# Patient Record
Sex: Female | Born: 1950 | Race: White | Hispanic: No | Marital: Married | Smoking: Former smoker
Health system: Southern US, Community
[De-identification: ages and names within clinical notes are randomized; demographics above are authoritative.]

---

## 2016-04-11 ENCOUNTER — Encounter: Payer: Self-pay | Admitting: Internal Medicine

## 2016-04-11 ENCOUNTER — Ambulatory Visit (INDEPENDENT_AMBULATORY_CARE_PROVIDER_SITE_OTHER): Payer: Medicare Other | Admitting: Internal Medicine

## 2016-04-11 ENCOUNTER — Ambulatory Visit (INDEPENDENT_AMBULATORY_CARE_PROVIDER_SITE_OTHER)
Admission: RE | Admit: 2016-04-11 | Discharge: 2016-04-11 | Disposition: A | Discharge status: Home / Self Care | Payer: Medicare Other | Source: Ambulatory Visit | Attending: Internal Medicine | Admitting: Internal Medicine

## 2016-04-11 VITALS — BP 124/72 | HR 124 | Ht 65.5 in | Wt 135.6 lb

## 2016-04-11 DIAGNOSIS — J449 Chronic obstructive pulmonary disease, unspecified: Secondary | ICD-10-CM

## 2016-04-11 DIAGNOSIS — J9611 Chronic respiratory failure with hypoxia: Secondary | ICD-10-CM

## 2016-04-11 MED ORDER — TIOTROPIUM BROMIDE MONOHYDRATE 2.5 MCG/ACT IN AERS: 2.0000 | INHALATION_SPRAY | Freq: Every day | RESPIRATORY_TRACT | 11 refills | Status: DC

## 2016-04-11 NOTE — Patient Instructions (Addendum)
Plan A = Automatic = symbicort 160 Take 2 puffs first thing in am and then another 2 puffs about 12 hours later                                     spiriva respimat 2 each am   Plan B = Backup Only use your albuterol as a rescue medication to be used if you can't catch your breath by resting or doing a relaxed purse lip breathing pattern.  - The less you use it, the better it will work when you need it. - Ok to use the inhaler up to 2 puffs  every 4 hours if you must but call for appointment if use goes up over your usual need - Don't leave home without it !!  (think of it like the spare tire for your car)   Plan C = Crisis - only use your albuterol nebulizer if you first try Plan B and it fails to help > ok to use the nebulizer up to every 4 hours but if start needing it regularly call for immediate appointment    Please remember to go to the  x-ray department downstairs for your tests - we will call you with the results when they are available.  Please schedule a follow up office visit in 6 weeks, call sooner if needed

## 2016-04-11 NOTE — Progress Notes (Signed)
Subjective:    Patient ID: Emma Hudson, female    DOB: 04/11/1951,    MRN: [id number]  HPI  [age] yowf quit smoking 2009 with symbicort/spiriva dpi  dep since 2007 with dx of copd and no change in ex tol on 160 2bid and 2lpm hs then admitted to [facility] with ? CAP but not back to baseline so self referred to pulmonary clinic 04/11/2016    04/11/2016 1st [place] [facility] office visit/ Emma Hudson   Chief Complaint  Patient presents with  . Pulmonary Consult    Self referral. Pt states she was dxed with COPD in 2010.  Pt states she had PNA approx 5 wks ago, and her breathing is not quite back to her normal baseline. She had already been on 2lpm o2 at bedtime since April 2017, but when she was discharged from hospital recently they advised her to use 8lpm 24/7. She is only using with sleep though, and feels daytime o2 is not needed.     90% recovered from admit still weak  Doe = MMRC2 = can't walk a nl pace on a flat grade s sob but does fine slow and flat eg  shopping ok with buggy  Has proventil rarely, not using at  all the neb   No obvious other patterns in day to day or daytime variabilty or assoc chronic cough or cp or chest tightness, subjective wheeze overt sinus or hb symptoms. No unusual exp hx or h/o childhood pna/ asthma or knowledge of premature birth.  Sleeping ok without nocturnal  or early am exacerbation  of respiratory  c/o's or need for noct saba. Also denies any obvious fluctuation of symptoms with weather or environmental changes or other aggravating or alleviating factors except as outlined above   Current Medications, Allergies, Complete Past Medical History, Past Surgical History, Family History, and Social History were reviewed in [facility] electronic medical record.              Review of Systems  Constitutional: Negative for chills, fever and unexpected weight change.  HENT: Negative for congestion, dental problem, ear pain, nosebleeds, postnasal drip,  rhinorrhea, sinus pressure, sneezing, sore throat, trouble swallowing and voice change.   Eyes: Negative for visual disturbance.  Respiratory: Positive for shortness of breath. Negative for cough and choking.   Cardiovascular: Negative for chest pain and leg swelling.  Gastrointestinal: Negative for abdominal pain, diarrhea and vomiting.  Genitourinary: Negative for difficulty urinating.  Musculoskeletal: Negative for arthralgias.  Skin: Negative for rash.  Neurological: Negative for tremors, syncope and headaches.  Hematological: Does not bruise/bleed easily.       Objective:   Physical Exam  amb wf nad   Wt Readings from Last 3 Encounters:  04/11/16 135 lb 9.6 oz (61.5 kg)    Vital signs reviewed  - Note on arrival 02 sats  94% on RA / note resting tachycardia     HEENT: nl dentition, turbinates, and oropharynx. Nl external ear canals without cough reflex   NECK :  without JVD/Nodes/TM/ nl carotid upstrokes bilaterally   LUNGS: no acc muscle use,  slt barrel contour with distant bs bilaterally  but no wheeze   CV:  RRR  no s3 or murmur or increase in P2, nad no edema   ABD:  soft and nontender with nl inspiratory excursion in the supine position. No bruits or organomegaly appreciated, bowel sounds nl  MS:  Nl gait/ ext warm without deformities, calf tenderness, cyanosis or clubbing No obvious  joint restrictions   SKIN: warm and dry without lesions    NEURO:  alert, approp, nl sensorium with  no motor or cerebellar deficits apparent.       CXR PA and Lateral:   04/11/2016 :    I personally reviewed images and agree with radiology impression as follows:    Emphysema without acute disease.       Assessment & Plan:

## 2016-04-12 ENCOUNTER — Telehealth: Payer: Self-pay | Admitting: Internal Medicine

## 2016-04-12 DIAGNOSIS — J9611 Chronic respiratory failure with hypoxia: Secondary | ICD-10-CM | POA: Insufficient documentation

## 2016-04-12 NOTE — Progress Notes (Signed)
LMTCB

## 2016-04-12 NOTE — Assessment & Plan Note (Signed)
02 rx since 2007  - as of initial pulmonary eval 04/11/2016 = 2lpm hs only - 04/11/2016   Walked RA  2 laps @ 185 ft each stopped due to  Sob but no desat at nl  pace   Adequate control on present rx, reviewed in detail with pt > no change in rx needed  = 2lpm hs, no indication for amb 02 at this point/ probably would benefit the most from pulmonary rehab

## 2016-04-12 NOTE — Telephone Encounter (Signed)
Notes Recorded by Nyoka CowdenMichael B Wert, MD on 04/12/2016 at 5:43 AM EST Call pt: Reviewed cxr and no acute change so [facility]   lmtcb

## 2016-04-12 NOTE — Assessment & Plan Note (Signed)
Spirometry 04/11/2016  FEV1 0.75 (30%)  Ratio 43 p am spiriva dpi/ sym 160  With classic curvature  -04/11/2016  After extensive coaching HFA effectiveness =    90%  04/11/2016 changed to spiriva respimat    Hx and spirometry c/w very severe copd =  Group D in terms of symptom/risk and laba/lama/ICS  therefore appropriate rx at this point.  The only component of rx I rec changing is dpi to respimat for less upper airway irritation and better lung deposition - perhaps if remains exac free could also consider just lama/laba = stiolto if insurance covers  Total time devoted to counseling  > 50 % of 60  office visit:  review case with pt/ discussion of options/alternatives/ personally creating written customized instructions  in presence of pt  then going over those specific  Instructions directly with the pt including how to use all of the meds but in particular covering each new medication in detail and the difference between the maintenance/automatic meds and the prns using an action plan format for the latter.  Please see AVS from this visit for a full list of these instructions

## 2016-04-13 NOTE — Telephone Encounter (Signed)
Spoke with pt. She is aware of results. Nothing further was needed.  

## 2016-05-23 ENCOUNTER — Encounter: Payer: Self-pay | Admitting: Internal Medicine

## 2016-05-23 ENCOUNTER — Ambulatory Visit (INDEPENDENT_AMBULATORY_CARE_PROVIDER_SITE_OTHER): Payer: Medicare Other | Admitting: Internal Medicine

## 2016-05-23 VITALS — BP 102/70 | HR 94 | Ht 64.5 in | Wt 136.2 lb

## 2016-05-23 DIAGNOSIS — J9611 Chronic respiratory failure with hypoxia: Secondary | ICD-10-CM

## 2016-05-23 DIAGNOSIS — J449 Chronic obstructive pulmonary disease, unspecified: Secondary | ICD-10-CM

## 2016-05-23 MED ORDER — TIOTROPIUM BROMIDE MONOHYDRATE 2.5 MCG/ACT IN AERS: 2.0000 | INHALATION_SPRAY | Freq: Every day | RESPIRATORY_TRACT | 11 refills | Status: DC

## 2016-05-23 NOTE — Patient Instructions (Addendum)
Please see patient coordinator before you leave today  to schedule [employer] to change out your 02 concentrator and call us with a name of any person there that does not help you with this   No change in medications - will refer you to rehab in the spring   Please schedule a follow up visit in 3 months but call sooner if needed

## 2016-05-23 NOTE — Progress Notes (Signed)
Subjective:    Patient ID: Emma Hudson, female    DOB: December 24, 1950,    MRN: [id number]   Brief patient profile:  3 yowf quit smoking 2009 with symbicort/spiriva dpi  dep since 2007 with dx of copd and no change in ex tol on 160 2bid and 2lpm hs then admitted to [facility] with ? CAP but not back to baseline so self referred to pulmonary clinic 04/11/2016    History of Present Illness  04/11/2016 1st [place] [facility] office visit/ Wert   Chief Complaint  Patient presents with  . Pulmonary Consult    Self referral. Pt states she was dxed with COPD in 2010.  Pt states she had PNA approx 5 wks ago, and her breathing is not quite back to her normal baseline. She had already been on 2lpm o2 at bedtime since April 2017, but when she was discharged from hospital recently they advised her to use 8lpm 24/7. She is only using with sleep though, and feels daytime o2 is not needed.     90% recovered from admit still weak  Doe = MMRC2 = can't walk a nl pace on a flat grade s sob but does fine slow and flat eg  shopping ok with buggy  Has proventil rarely, not using at  all the neb  rec Plan A = Automatic = symbicort 160 Take 2 puffs first thing in am and then another 2 puffs about 12 hours later                                     spiriva respimat 2 each am  Plan B = Backup Only use your albuterol as a rescue medication  Plan C = Crisis - only use your albuterol nebulizer if you first try Plan B     05/23/2016  f/u ov/Wert re: copd III/ symb 160 and spiriva  On 2lpm  Chief Complaint  Patient presents with  . Follow-up    Pt stated the cold air is affecting her COPD and is causing her to be SOB that resolves when she goes into warm areas. Pt denies chest tightness or mucus.     no change doe/ having trouble with spiriva respmat "no spray" comes out  As long as doesn't go out in cold no change doe = MMRC2 as sobve/ no need for saba    No obvious day to day or daytime variability or assoc  excess/ purulent sputum or mucus plugs or hemoptysis or cp or chest tightness, subjective wheeze or overt sinus or hb symptoms. No unusual exp hx or h/o childhood pna/ asthma or knowledge of premature birth.  Sleeping ok without nocturnal  or early am exacerbation  of respiratory  c/o's or need for noct saba. Also denies any obvious fluctuation of symptoms with weather or environmental changes or other aggravating or alleviating factors except as outlined above   Current Medications, Allergies, Complete Past Medical History, Past Surgical History, Family History, and Social History were reviewed in [employer] record.  ROS  The following are not active complaints unless bolded sore throat, dysphagia, dental problems, itching, sneezing,  nasal congestion or excess/ purulent secretions, ear ache,   fever, chills, sweats, unintended wt loss, classically pleuritic or exertional cp,  orthopnea pnd or leg swelling, presyncope, palpitations, abdominal pain, anorexia, nausea, vomiting, diarrhea  or change in bowel or bladder habits, change in stools or  urine, dysuria,hematuria,  rash, arthralgias, visual complaints, headache, numbness, weakness or ataxia or problems with walking or coordination,  change in mood/affect or memory.                Objective:   Physical Exam  amb mildly hoarse  wf nad   Wt Readings from Last 3 Encounters:  05/23/16 136 lb 3.2 oz (61.8 kg)  04/11/16 135 lb 9.6 oz (61.5 kg)    Vital signs reviewed  - Note on arrival 02 sats  97% on RA      HEENT: nl dentition, turbinates, and oropharynx. Nl external ear canals without cough reflex   NECK :  without JVD/Nodes/TM/ nl carotid upstrokes bilaterally   LUNGS: no acc muscle use,  slt barrel contour with distant bs bilaterally  but no wheezes at all   CV:  RRR  no s3 or murmur or increase in P2, nad no edema   ABD:  soft and nontender with nl inspiratory excursion in the supine position. No  bruits or organomegaly appreciated, bowel sounds nl  MS:  Nl gait/ ext warm without deformities, calf tenderness, cyanosis or clubbing No obvious joint restrictions   SKIN: warm and dry without lesions    NEURO:  alert, approp, nl sensorium with  no motor or cerebellar deficits apparent.             Assessment & Plan:

## 2016-05-23 NOTE — Assessment & Plan Note (Signed)
02 rx since 2007  - as of initial pulmonary eval 04/11/2016 = 2lpm hs only - 04/11/2016   Walked RA  2 laps @ 185 ft each stopped due to  Sob but no desat at nl  pace   As of 05/23/2016 >    2lpm  Hs only

## 2016-05-24 ENCOUNTER — Encounter: Payer: Self-pay | Admitting: Internal Medicine

## 2016-05-24 NOTE — Assessment & Plan Note (Signed)
Quit 2009 Spirometry 04/11/2016  FEV1 0.75 (30%)  Ratio 43 p am spiriva dpi/ sym 160  With classic curvature  -04/11/2016  changed to spiriva respimat   - 05/23/2016  After extensive coaching HFA effectiveness =    90% with respimat/continue spiriva /symb  She has very severe copd but well comp at present and is  Group D in terms of symptom/risk and laba/lama/ICS  therefore appropriate rx at this point so no change rx needed but may consider change to stiolto or bevespi (LAMA/LABA) at next ov

## 2016-08-21 ENCOUNTER — Ambulatory Visit (INDEPENDENT_AMBULATORY_CARE_PROVIDER_SITE_OTHER)
Admission: RE | Admit: 2016-08-21 | Discharge: 2016-08-21 | Disposition: A | Discharge status: Home / Self Care | Payer: Medicare Other | Source: Ambulatory Visit | Attending: Internal Medicine | Admitting: Internal Medicine

## 2016-08-21 ENCOUNTER — Encounter: Payer: Self-pay | Admitting: Internal Medicine

## 2016-08-21 ENCOUNTER — Ambulatory Visit (INDEPENDENT_AMBULATORY_CARE_PROVIDER_SITE_OTHER): Payer: Medicare Other | Admitting: Internal Medicine

## 2016-08-21 VITALS — BP 124/76 | HR 102 | Ht 64.5 in | Wt 135.0 lb

## 2016-08-21 DIAGNOSIS — J449 Chronic obstructive pulmonary disease, unspecified: Secondary | ICD-10-CM

## 2016-08-21 DIAGNOSIS — J9611 Chronic respiratory failure with hypoxia: Secondary | ICD-10-CM | POA: Diagnosis not present

## 2016-08-21 DIAGNOSIS — J441 Chronic obstructive pulmonary disease with (acute) exacerbation: Secondary | ICD-10-CM | POA: Insufficient documentation

## 2016-08-21 MED ORDER — ALBUTEROL SULFATE HFA 108 (90 BASE) MCG/ACT IN AERS: INHALATION_SPRAY | RESPIRATORY_TRACT | 1 refills | Status: DC

## 2016-08-21 MED ORDER — PREDNISONE 10 MG PO TABS: ORAL_TABLET | ORAL | 0 refills | Status: DC

## 2016-08-21 NOTE — Progress Notes (Signed)
Spoke with pt and notified of results per Dr. Wert. Pt verbalized understanding and denied any questions. 

## 2016-08-21 NOTE — Patient Instructions (Addendum)
Prednisone 10 mg take  4 each am x 2 days,   2 each am x 2 days,  1 each am x 2 days and stop   Try prilosec otc   Take 30-60 min before first meal of the day and Pepcid ac (famotidine) 20 mg one @  bedtime until cough/choking are completely gone for at least a week without the need for cough suppression  Please see patient coordinator before you leave today  to schedule ambulatory 02 titration to see if eligible for POC   GERD (REFLUX)  is an extremely common cause of respiratory symptoms just like yours , many times with no obvious heartburn at all.    It can be treated with medication, but also with lifestyle changes including elevation of the head of your bed (ideally with 6 inch  bed blocks),  Smoking cessation, avoidance of late meals, excessive alcohol, and avoid fatty foods, chocolate, peppermint, colas, red wine, and acidic juices such as orange juice.  NO MINT OR MENTHOL PRODUCTS SO NO COUGH DROPS   USE SUGARLESS CANDY INSTEAD (Jolley ranchers or Stover's or Life Savers) or even ice chips will also do - the key is to swallow to prevent all throat clearing. NO OIL BASED VITAMINS - use powdered substitutes.    Please remember to go to the   x-ray department downstairs in the basement  for your tests - we will call you with the results when they are available.   Please schedule a follow up visit in 3 months but call sooner if needed

## 2016-08-21 NOTE — Progress Notes (Signed)
Subjective:    Patient ID: Emma Hudson, female    DOB: March 12, 1951,    MRN: [id number]   Brief patient profile:  53 yowf quit smoking 2009 with symbicort/spiriva dpi  dep since 2007 with dx of copd and no change in ex tol on 160 2bid and 2lpm hs then admitted to [facility] with ? CAP but not back to baseline so self referred to pulmonary clinic 04/11/2016    History of Present Illness  04/11/2016 1st [place] [facility] office visit/ Marquette Blodgett   Chief Complaint  Patient presents with  . Pulmonary Consult    Self referral. Pt states she was dxed with COPD in 2010.  Pt states she had PNA approx 5 wks ago, and her breathing is not quite back to her normal baseline. She had already been on 2lpm o2 at bedtime since April 2017, but when she was discharged from hospital recently they advised her to use 8lpm 24/7. She is only using with sleep though, and feels daytime o2 is not needed.     90% recovered from admit still weak  Doe = MMRC2 = can't walk a nl pace on a flat grade s sob but does fine slow and flat eg  shopping ok with buggy  Has proventil rarely, not using at  all the neb  rec Plan A = Automatic = symbicort 160 Take 2 puffs first thing in am and then another 2 puffs about 12 hours later                                     spiriva respimat 2 each am  Plan B = Backup Only use your albuterol as a rescue medication  Plan C = Crisis - only use your albuterol nebulizer if you first try Plan B      08/21/2016  f/u ov/Emma Hudson re: copd III on symb/spiriva / declined rehab "too far to travel"  Chief Complaint  Patient presents with  . Follow-up    Pt c/o worsening SOB after having influenza 5 wks ago. She states she is having a "dry, choking cough".  She states she is using albuterol inhaler 2 x per wk and neb with albuterol at least once per day on average.   since viral uri doe across the room and new c/o  choking on food points to neck, suprasternal notch where still has "tickle"  Sleeping fine  on 2lpm hs but no amb 02   No obvious day to day or daytime variability or assoc excess/ purulent sputum or mucus plugs or hemoptysis or cp or chest tightness, subjective wheeze or overt sinus or hb symptoms. No unusual exp hx or h/o childhood pna/ asthma or knowledge of premature birth.  Sleeping ok without nocturnal  or early am exacerbation  of respiratory  c/o's or need for noct saba. Also denies any obvious fluctuation of symptoms with weather or environmental changes or other aggravating or alleviating factors except as outlined above   Current Medications, Allergies, Complete Past Medical History, Past Surgical History, Family History, and Social History were reviewed in [employer] record.  ROS  The following are not active complaints unless bolded sore throat, dysphagia, dental problems, itching, sneezing,  nasal congestion or excess/ purulent secretions, ear ache,   fever, chills, sweats, unintended wt loss, classically pleuritic or exertional cp,  orthopnea pnd or leg swelling, presyncope, palpitations, abdominal pain, anorexia, nausea, vomiting,  diarrhea  or change in bowel or bladder habits, change in stools or urine, dysuria,hematuria,  rash, arthralgias, visual complaints, headache, numbness, weakness or ataxia or problems with walking or coordination,  change in mood/affect or memory.             Objective:   Physical Exam  amb wf nad/ clearing throat frequently    08/21/2016      135   05/23/16 136 lb 3.2 oz (61.8 kg)  04/11/16 135 lb 9.6 oz (61.5 kg)    Vital signs reviewed  - Note on arrival 02 sats  94% on RA      HEENT: nl dentition, turbinates, and oropharynx. Nl external ear canals without cough reflex   NECK :  without JVD/Nodes/TM/ nl carotid upstrokes bilaterally   LUNGS: no acc muscle use,  slt barrel contour with distant bs bilaterally  but no wheezing  CV:  RRR  no s3 or murmur or increase in P2, nad no edema   ABD:  soft and  nontender with nl inspiratory excursion in the supine position. No bruits or organomegaly appreciated, bowel sounds nl  MS:  Nl gait/ ext warm without deformities, calf tenderness, cyanosis or clubbing No obvious joint restrictions   SKIN: warm and dry without lesions    NEURO:  alert, approp, nl sensorium with  no motor or cerebellar deficits apparent.       CXR PA and Lateral:   08/21/2016 :    I personally reviewed images and agree with radiology impression as follows:   No acute cardiopulmonary disease. Left base mild pleuroparenchymal scarring. Chest is stable from prior exam.      Assessment & Plan:

## 2016-08-22 NOTE — Assessment & Plan Note (Signed)
Quit 2009 Spirometry 04/11/2016  FEV1 0.75 (30%)  Ratio 43 p am spiriva dpi/ sym 160  With classic curvature  -04/11/2016  changed to spiriva respimat    08/21/2016  After extensive coaching HFA effectiveness =    75% (short Ti)   It may be that poor hfa is causing throat irritation now or non -specific pnds p viral uri but most likely it's gerd  Of the three most common causes of  Sub-acute or recurrent or chronic cough, only one (GERD)  can actually contribute to/ trigger  the other two (asthma and post nasal drip syndrome)  and perpetuate the cylce of cough.  While not intuitively obvious, many patients with chronic low grade reflux do not cough until there is a primary insult that disturbs the protective epithelial barrier and exposes sensitive nerve endings.   This is typically viral but can be direct physical injury such as with an endotracheal tube.   The point is that once this occurs, it is difficult to eliminate the cycle  using anything but a maximally effective acid suppression regimen at least in the short run, accompanied by an appropriate diet to address non acid GERD and control / eliminate the cough itself for at least 3 days.   rec max rx for gerd, no change in maint rx/ work on better hfa/ and Prednisone 10 mg take  4 each am x 2 days,   2 each am x 2 days,  1 each am x 2 days and stop   I had an extended discussion with the patient reviewing all relevant studies completed to date and  lasting 15 to 20 minutes of a 25 minute visit    Each maintenance medication was reviewed in detail including most importantly the difference between maintenance and prns and under what circumstances the prns are to be triggered using an action plan format that is not reflected in the computer generated alphabetically organized AVS.    Please see AVS for specific instructions unique to this visit that I personally wrote and verbalized to the the pt in detail and then reviewed with pt  by my nurse  highlighting any  changes in therapy recommended at today's visit to their plan of care.

## 2016-08-22 NOTE — Assessment & Plan Note (Signed)
02 rx since 2007  - as of initial pulmonary eval 04/11/2016 = 2lpm hs only - 04/11/2016   Walked RA  2 laps @ 185 ft each stopped due to  Sob but no desat at nl  pace  - 08/21/2016 Patient Saturations on Room Air at Rest = 91% > while Ambulating = 87% Patient Saturations on 2 Liters of oxygen while Ambulating = 93%  As of 08/21/2016 >    2lpm  Hs only and 2lpm with exertion

## 2016-11-20 ENCOUNTER — Ambulatory Visit: Payer: Medicare Other | Admitting: Internal Medicine

## 2016-12-26 ENCOUNTER — Encounter: Payer: Self-pay | Admitting: Internal Medicine

## 2016-12-26 ENCOUNTER — Ambulatory Visit (INDEPENDENT_AMBULATORY_CARE_PROVIDER_SITE_OTHER): Payer: Medicare Other | Admitting: Internal Medicine

## 2016-12-26 VITALS — BP 86/58 | HR 93 | Ht 64.5 in | Wt 136.0 lb

## 2016-12-26 DIAGNOSIS — I10 Essential (primary) hypertension: Secondary | ICD-10-CM | POA: Diagnosis not present

## 2016-12-26 DIAGNOSIS — J449 Chronic obstructive pulmonary disease, unspecified: Secondary | ICD-10-CM | POA: Diagnosis not present

## 2016-12-26 DIAGNOSIS — J9611 Chronic respiratory failure with hypoxia: Secondary | ICD-10-CM | POA: Diagnosis not present

## 2016-12-26 MED ORDER — FAMOTIDINE 20 MG PO TABS: ORAL_TABLET | ORAL | 2 refills | Status: DC

## 2016-12-26 MED ORDER — PANTOPRAZOLE SODIUM 40 MG PO TBEC: 40.0000 mg | DELAYED_RELEASE_TABLET | Freq: Every day | ORAL | 2 refills | Status: DC

## 2016-12-26 MED ORDER — PREDNISONE 10 MG PO TABS: ORAL_TABLET | ORAL | 0 refills | Status: DC

## 2016-12-26 NOTE — Assessment & Plan Note (Signed)
Over treated at present > rec hold amlodipine when bp < 110 systolic,  Follow up per Primary Care planned

## 2016-12-26 NOTE — Patient Instructions (Addendum)
Pantoprazole (protonix) 40 mg   Take  30-60 min before first meal of the day and Pepcid (famotidine)  20 mg one @  bedtime until return to office - this is the best way to tell whether stomach acid is contributing to your problem.    Prednisone 10 mg take  4 each am x 2 days,   2 each am x 2 days,  1 each am x 2 days and stop   Hold norvasc when your blood pressure is running under 110 on the top number   Please see patient coordinator before you leave today  to sort out what is it [employer] needs for portable 02   Please schedule a follow up office visit in 6 weeks to see Tammy NP  call sooner if needed with all medications /inhalers/ solutions in hand so we can verify exactly what you are taking. This includes all medications from all doctors and over the counters

## 2016-12-26 NOTE — Assessment & Plan Note (Signed)
Quit 2009 Spirometry 04/11/2016  FEV1 0.75 (30%)  Ratio 43 p am spiriva dpi/ sym 160  With classic curvature  -04/11/2016  changed to spiriva respimat    12/26/2016  After extensive coaching HFA effectiveness =  90%   DDX of  difficult airways management almost all start with A and  include Adherence, Ace Inhibitors, Acid Reflux, Active Sinus Disease, Alpha 1 Antitripsin deficiency, Anxiety masquerading as Airways dz,  ABPA,  Allergy(esp in young), Aspiration (esp in elderly), Adverse effects of meds,  Active smokers, A bunch of PE's (a small clot burden can't cause this syndrome unless there is already severe underlying pulm or vascular dz with poor reserve) plus two Bs  = Bronchiectasis and Beta blocker use..and one C= CHF   Adherence is always the initial "prime suspect" and is a multilayered concern that requires a "trust but verify" approach in every patient - starting with knowing how to use medications, especially inhalers, correctly, keeping up with refills and understanding the fundamental difference between maintenance and prns vs those medications only taken for a very short course and then stopped and not refilled.  - see hfa teaching - return with all meds in hand using a trust but verify approach to confirm accurate Medication  Reconciliation The principal here is that until we are certain that the  patients are doing what we've asked, it makes no sense to ask them to do more.    ? Acid (or non-acid) GERD > always difficult to exclude as up to 75% of pts in some series report no assoc GI/ Heartburn symptoms> rec max (24h)  acid suppression and diet restrictions/ reviewed and instructions given in writing.   ? Allergy/asthmatic component > Prednisone 10 mg take  4 each am x 2 days,   2 each am x 2 days,  1 each am x 2 days and stop and continue high dose ics   I had an extended discussion with the patient reviewing all relevant studies completed to date and  lasting 15 to 20 minutes of a  25 minute visit    Each maintenance medication was reviewed in detail including most importantly the difference between maintenance and prns and under what circumstances the prns are to be triggered using an action plan format that is not reflected in the computer generated alphabetically organized AVS.    Please see AVS for specific instructions unique to this visit that I personally wrote and verbalized to the the pt in detail and then reviewed with pt  by my nurse highlighting any  changes in therapy recommended at today's visit to their plan of care.

## 2016-12-26 NOTE — Progress Notes (Signed)
Subjective:    Patient ID: Emma Hudson, female    DOB: 15-Nov-1950,    MRN: [id number]   Brief patient profile:  61 yowf quit smoking 2009 with symbicort/spiriva dpi  dep since 2007 with dx of copd and no change in ex tol on 160 2bid and 2lpm hs then admitted to [facility] with ? CAP but not back to baseline so self referred to pulmonary clinic 04/11/2016    History of Present Illness  04/11/2016 1st [facility] office visit/ Emma Hudson   Chief Complaint  Patient presents with  . Pulmonary Consult    Self referral. Pt states she was dxed with COPD in 2010.  Pt states she had PNA approx 5 wks ago, and her breathing is not quite back to her normal baseline. She had already been on 2lpm o2 at bedtime since April 2017, but when she was discharged from hospital recently they advised her to use 8lpm 24/7. She is only using with sleep though, and feels daytime o2 is not needed.     90% recovered from admit still weak  Doe = MMRC2 = can't walk a nl pace on a flat grade s sob but does fine slow and flat eg  shopping ok with buggy  Has proventil rarely, not using at  all the neb  rec Plan A = Automatic = symbicort 160 Take 2 puffs first thing in am and then another 2 puffs about 12 hours later                                     spiriva respimat 2 each am  Plan B = Backup Only use your albuterol as a rescue medication  Plan C = Crisis - only use your albuterol nebulizer if you first try Plan B      08/21/2016  f/u ov/Emma Hudson re: copd III on symb/spiriva / declined rehab "too far to travel"  Chief Complaint  Patient presents with  . Follow-up    Pt c/o worsening SOB after having influenza 5 wks ago. She states she is having a "dry, choking cough".  She states she is using albuterol inhaler 2 x per wk and neb with albuterol at least once per day on average.   since viral uri doe across the room and new c/o  choking on food points to neck, suprasternal notch where still has "tickle"  Sleeping fine  on 2lpm hs but no amb 02  rec Prednisone 10 mg take  4 each am x 2 days,   2 each am x 2 days,  1 each am x 2 days and stop  Try prilosec otc 20mg   Take 30-60 min before first meal of the day and Pepcid ac (famotidine) 20 mg one @  bedtime until cough/choking are completely gone for at least a week without the need for cough suppression> did not do  Please see patient coordinator before you leave today  to schedule ambulatory 02 titration to see if eligible for POC> did not obtain portable 02  GERD diet        12/26/2016  f/u ov/Emma Hudson re:  COPD GOLD III symb/spiriva / not taking ppi/h2hs as instructed  Chief Complaint  Patient presents with  . Follow-up    Pt states breathing has been worse and she has had sore throat recently.  She also c/o non prod cough. She is using her albuterol inhaler 3 x  per wk and neb with albuterol 2 x per wk on average.   sleeps fine on 2lpm s am cough / then cough starts up dry feels choked / globus senstiona/ assoc With watery rhinitis Prednisone always helps / not using albuterol daily / never at hs  Doe=  MMRC3 = can't walk 100 yards even at a slow pace at a flat grade s stopping due to sob  s 02   No obvious day to day or daytime variability or assoc excess/ purulent sputum or mucus plugs or hemoptysis or cp or chest tightness, subjective wheeze or overt sinus or hb symptoms. No unusual exp hx or h/o childhood pna/ asthma or knowledge of premature birth.  Sleeping ok without nocturnal  or early am exacerbation  of respiratory  c/o's or need for noct saba.  Also denies any obvious fluctuation of symptoms with weather or environmental changes or other aggravating or alleviating factors except as outlined above   Current Medications, Allergies, Complete Past Medical History, Past Surgical History, Family History, and Social History were reviewed in [employer] record.  ROS  The following are not active complaints unless bolded sore throat  no better p abx x 2 , dysphagia, dental problems, itching, sneezing,  nasal congestion or excess/ purulent secretions, ear ache,   fever, chills, sweats, unintended wt loss, classically pleuritic or exertional cp,  orthopnea pnd or leg swelling, presyncope, palpitations, abdominal pain, anorexia, nausea, vomiting, diarrhea  or change in bowel or bladder habits, change in stools or urine, dysuria,hematuria,  rash, arthralgias, visual complaints, headache, numbness, weakness sometimes standing  or ataxia or problems with walking or coordination,  change in mood/affect or memory.             Objective:   Physical Exam  amb wf nad   08/21/2016      135   05/23/16 136 lb 3.2 oz (61.8 kg)  04/11/16 135 lb 9.6 oz (61.5 kg)    Vital signs reviewed  - Note on arrival 02 sats  92% on RA  - note bp 86/58 and mild orthostasis     HEENT: nl dentition, turbinates, and oropharynx which is pristine/ no thrush. Nl external ear canals without cough reflex   NECK :  without JVD/Nodes/TM/ nl carotid upstrokes bilaterally   LUNGS: no acc muscle use,  slt barrel contour  Distant bs/ faint end exp wheeze bilaterally   CV:  RRR  no s3 or murmur or increase in P2, nad no edema   ABD:  soft and nontender with nl inspiratory excursion in the supine position. No bruits or organomegaly appreciated, bowel sounds nl  MS:  Nl gait/ ext warm without deformities, calf tenderness, cyanosis or clubbing No obvious joint restrictions   SKIN: warm and dry without lesions    NEURO:  alert, approp, nl sensorium with  no motor or cerebellar deficits apparent.           Assessment & Plan:

## 2017-01-12 ENCOUNTER — Telehealth: Payer: Self-pay | Admitting: Internal Medicine

## 2017-01-12 DIAGNOSIS — J449 Chronic obstructive pulmonary disease, unspecified: Secondary | ICD-10-CM

## 2017-01-12 NOTE — Telephone Encounter (Signed)
Called and spoke with pt and she stated that she spoke with someone about the order that was faxed to [facility].  She couldn't remember her name, but looks like Cordelia PenSherry did this on 8/21.  PCC"s please advise. Thanks

## 2017-01-12 NOTE — Telephone Encounter (Signed)
Pt wants to change her 02 to inogen we need orders placed for this Tobe SosSally E Ottinger

## 2017-01-12 NOTE — Telephone Encounter (Signed)
[facility]  Nothing further is needed.

## 2017-01-23 ENCOUNTER — Other Ambulatory Visit: Payer: Self-pay | Admitting: Internal Medicine

## 2017-01-30 ENCOUNTER — Other Ambulatory Visit: Payer: Self-pay | Admitting: Internal Medicine

## 2017-02-06 ENCOUNTER — Encounter: Payer: Self-pay | Admitting: Adult Health

## 2017-02-06 ENCOUNTER — Ambulatory Visit (INDEPENDENT_AMBULATORY_CARE_PROVIDER_SITE_OTHER): Payer: Medicare Other | Admitting: Adult Health

## 2017-02-06 DIAGNOSIS — J9611 Chronic respiratory failure with hypoxia: Secondary | ICD-10-CM

## 2017-02-06 DIAGNOSIS — J449 Chronic obstructive pulmonary disease, unspecified: Secondary | ICD-10-CM | POA: Diagnosis not present

## 2017-02-06 NOTE — Patient Instructions (Signed)
Continue on Symbicort and Spiriva. Get flu shot as discussed Use  oxygen 2 L with activity and at bedtime Order to DME for portable oxygen system Follow med calendar closely and bring to each visit .  Follow Dr. Sherene Sires in 3-4 months and as needed

## 2017-02-06 NOTE — Progress Notes (Signed)
  ID: Emma Hudson, female    DOB: 03/03/1951, 66 y.o.   MRN: [id number]  Chief Complaint  Patient presents with  . Follow-up    COPD     Referring provider: Alton Revere, MD  HPI: 66 yo female former smoker followed for GOLD III COPD and O2 dependent RF   TEST  Spirometry 04/11/2016  FEV1 0.75 (30%)  Ratio 43   02/06/2017 Follow up : COPD and O2 RF  Pt returns for 6 week follow up For COPD. Patient says overall her breathing is some improved since last visit. She was having a COPD flare with increased shortness of breath and wheezing. She was given a prednisone taper. Patient says she still feels short of breath with activities. She denies any chest pain, orthopnea, PND, or increased leg swelling Remains on Symbicort and Spiriva. We reviewed all her medications organize them into a medication count with patient education. She is suppose to be oxygen with activity as O2 sats drop with walking . POC was ordered but she has not received. We called DME and they said insurance was holding up but it has been several months waiting.        Allergies  Allergen Reactions  . Asa [Aspirin] Anaphylaxis  . Codeine Sulfate Nausea And Vomiting    Immunization History  Administered Date(s) Administered  . Influenza Whole 01/07/2016  . Pneumococcal Conjugate-13 02/29/2016    History reviewed. No pertinent past medical history.  Tobacco History: History  Smoking Status  . Former Smoker  . Packs/day: 0.75  . Years: 20.00  . Types: Cigarettes  . Quit date: 05/09/2007  Smokeless Tobacco  . Never Used   Counseling given: Not Answered   Outpatient Encounter Prescriptions as of 02/06/2017  Medication Sig  . albuterol (PROAIR HFA) 108 (90 Base) MCG/ACT inhaler 2 puffs every 4 hours as needed only  if your can't catch your breath  . albuterol (PROVENTIL) (2.5 MG/3ML) 0.083% nebulizer solution Take 2.5 mg by nebulization every 6 (six) hours as needed for wheezing or shortness of  breath.  Marland Kitchen amLODipine (NORVASC) 5 MG tablet Take 5 mg by mouth daily.  . budesonide-formoterol (SYMBICORT) 160-4.5 MCG/ACT inhaler Inhale 2 puffs into the lungs 2 (two) times daily.  . famotidine (PEPCID) 20 MG tablet TAKE 1 TABLET BY MOUTH AT BEDTIME  . irbesartan-hydrochlorothiazide (AVALIDE) 150-12.5 MG tablet Take 1 tablet by mouth daily.  Marland Kitchen nystatin (MYCOSTATIN) 100000 UNIT/ML suspension 1 tsp 4 x daily  . OXYGEN 2lpm with sleep  [facility] [place]  . pantoprazole (PROTONIX) 40 MG tablet TAKE 1 TABLET 30 TO 60 MINUTES BEFORE FIRST MEAL OF DAY  . potassium chloride (K-DUR,KLOR-CON) 10 MEQ tablet Take 10 mEq by mouth daily.  . predniSONE (DELTASONE) 10 MG tablet Take  4 each am x 2 days,   2 each am x 2 days,  1 each am x 2 days and stop  . Tiotropium Bromide Monohydrate (SPIRIVA RESPIMAT) 2.5 MCG/ACT AERS Inhale 2 puffs into the lungs daily.  . Vitamin D, Ergocalciferol, (DRISDOL) 50000 units CAPS capsule Take 50,000 Units by mouth every 7 (seven) days.   No facility-administered encounter medications on file as of 02/06/2017.      Review of Systems  Constitutional:   No  weight loss, night sweats,  Fevers, chills, fatigue, or  lassitude.  HEENT:   No headaches,  Difficulty swallowing,  Tooth/dental problems, or  Sore throat,  No sneezing, itching, ear ache, nasal congestion, post nasal drip,   CV:  No chest pain,  Orthopnea, PND, swelling in lower extremities, anasarca, dizziness, palpitations, syncope.   GI  No heartburn, indigestion, abdominal pain, nausea, vomiting, diarrhea, change in bowel habits, loss of appetite, bloody stools.   Resp: No chest wall deformity  Skin: no rash or lesions.  GU: no dysuria, change in color of urine, no urgency or frequency.  No flank pain, no hematuria   MS:  No joint pain or swelling.  No decreased range of motion.  No back pain.    Physical Exam  BP 104/74 (BP Location: Left Arm, Cuff Size: Normal)   Pulse 90    Ht 5' 4.5" (1.638 m)   Wt 137 lb 3.2 oz (62.2 kg)   SpO2 95%   BMI 23.19 kg/m   GEN: A/Ox3; pleasant , NAD, elderly    HEENT:  [place]/AT,  EACs-clear, TMs-wnl, NOSE-clear, THROAT-clear, no lesions, no postnasal drip or exudate noted.   NECK:  Supple w/ fair ROM; no JVD; normal carotid impulses w/o bruits; no thyromegaly or nodules palpated; no lymphadenopathy.    RESP  Clear  P & A; w/o, wheezes/ rales/ or rhonchi. no accessory muscle use, no dullness to percussion  CARD:  RRR, no m/r/g, no peripheral edema, pulses intact, no cyanosis or clubbing.  GI:   Soft & nt; nml bowel sounds; no organomegaly or masses detected.   Musco: Warm bil, no deformities or joint swelling noted.   Neuro: alert, no focal deficits noted.    Skin: Warm, no lesions or rashes    Lab Results:  CBC No results found for: WBC, RBC, HGB, HCT, PLT, MCV, MCH, MCHC, RDW, LYMPHSABS, MONOABS, EOSABS, BASOSABS  BMET No results found for: NA, K, CL, CO2, GLUCOSE, BUN, CREATININE, CALCIUM, GFRNONAA, GFRAA  BNP No results found for: BNP  ProBNP No results found for: PROBNP  Imaging: No results found.   Assessment & Plan:   COPD GOLD III Recent flare now resolving   Plan  Patient Instructions  Continue on Symbicort and Spiriva. Get flu shot as discussed Use  oxygen 2 L with activity and at bedtime Order to DME for portable oxygen system Follow med calendar closely and bring to each visit .  Follow Dr. Sherene Sires in 3-4 months and as needed     Chronic respiratory failure with hypoxia (HCC) Cont on O2 At bedtime   Order for POC to DME -new company   Plan  Patient Instructions  Continue on Symbicort and Spiriva. Get flu shot as discussed Use  oxygen 2 L with activity and at bedtime Order to DME for portable oxygen system Follow med calendar closely and bring to each visit .  Follow Dr. Sherene Sires in 3-4 months and as needed        Rubye Oaks, NP 02/06/2017

## 2017-02-06 NOTE — Assessment & Plan Note (Signed)
Cont on O2 At bedtime   Order for POC to DME -new company   Plan  Patient Instructions  Continue on Symbicort and Spiriva. Get flu shot as discussed Use  oxygen 2 L with activity and at bedtime Order to DME for portable oxygen system Follow med calendar closely and bring to each visit .  Follow Dr. Sherene Sires in 3-4 months and as needed

## 2017-02-06 NOTE — Assessment & Plan Note (Signed)
Recent flare now resolving   Plan  Patient Instructions  Continue on Symbicort and Spiriva. Get flu shot as discussed Use  oxygen 2 L with activity and at bedtime Order to DME for portable oxygen system Follow med calendar closely and bring to each visit .  Follow Dr. Sherene Sires in 3-4 months and as needed

## 2017-02-07 ENCOUNTER — Telehealth: Payer: Self-pay | Admitting: Internal Medicine

## 2017-02-07 NOTE — Telephone Encounter (Signed)
Called pt & spoke to her about poc order.  She was concerned that we were sending order to a different company other than [facility] and I assured her we were note.  I did call [facility] in [place] yesterday & spoke to Rye.  Order for poc has been sent to corporate & Herbert Seta states this can take 30 days.  Pt is fine with that & states she can wait for it.

## 2017-02-07 NOTE — Telephone Encounter (Signed)
Carlyon Shadow & I were documenting at the same time.  I have taken care of this per my previous note.  Nothing further needed.

## 2017-02-07 NOTE — Progress Notes (Signed)
Chart and office note reviewed in detail  > agree with a/p as outlined    

## 2017-02-07 NOTE — Telephone Encounter (Signed)
Patient stated she called to leave a message for Cordelia Pen and no one has called her back.

## 2017-02-07 NOTE — Telephone Encounter (Signed)
lmtcb X1 for pt. Per chart under order to change DME it looks like this switch has been approved but takes 30 days to process?   PCC's please advise.  Thanks.

## 2017-05-11 ENCOUNTER — Encounter: Payer: Self-pay | Admitting: Internal Medicine

## 2017-05-11 ENCOUNTER — Ambulatory Visit (INDEPENDENT_AMBULATORY_CARE_PROVIDER_SITE_OTHER): Payer: Medicare Other | Admitting: Internal Medicine

## 2017-05-11 VITALS — BP 112/78 | HR 94 | Ht 64.5 in | Wt 138.8 lb

## 2017-05-11 DIAGNOSIS — J9611 Chronic respiratory failure with hypoxia: Secondary | ICD-10-CM

## 2017-05-11 DIAGNOSIS — J449 Chronic obstructive pulmonary disease, unspecified: Secondary | ICD-10-CM

## 2017-05-11 NOTE — Patient Instructions (Addendum)
No change in medications    Please schedule a follow up visit in 6  months but call sooner if needed  

## 2017-05-11 NOTE — Progress Notes (Signed)
Subjective:    Patient ID: Emma Hudson, female    DOB: 15-Nov-1950,    MRN: [id number]   Brief patient profile:  61 yowf quit smoking 2009 with symbicort/spiriva dpi  dep since 2007 with dx of copd and no change in ex tol on 160 2bid and 2lpm hs then admitted to [facility] with ? CAP but not back to baseline so self referred to pulmonary clinic 04/11/2016    History of Present Illness  04/11/2016 1st [facility] office visit/ Palmira Stickle   Chief Complaint  Patient presents with  . Pulmonary Consult    Self referral. Pt states she was dxed with COPD in 2010.  Pt states she had PNA approx 5 wks ago, and her breathing is not quite back to her normal baseline. She had already been on 2lpm o2 at bedtime since April 2017, but when she was discharged from hospital recently they advised her to use 8lpm 24/7. She is only using with sleep though, and feels daytime o2 is not needed.     90% recovered from admit still weak  Doe = MMRC2 = can't walk a nl pace on a flat grade s sob but does fine slow and flat eg  shopping ok with buggy  Has proventil rarely, not using at  all the neb  rec Plan A = Automatic = symbicort 160 Take 2 puffs first thing in am and then another 2 puffs about 12 hours later                                     spiriva respimat 2 each am  Plan B = Backup Only use your albuterol as a rescue medication  Plan C = Crisis - only use your albuterol nebulizer if you first try Plan B      08/21/2016  f/u ov/Ellesse Antenucci re: copd III on symb/spiriva / declined rehab "too far to travel"  Chief Complaint  Patient presents with  . Follow-up    Pt c/o worsening SOB after having influenza 5 wks ago. She states she is having a "dry, choking cough".  She states she is using albuterol inhaler 2 x per wk and neb with albuterol at least once per day on average.   since viral uri doe across the room and new c/o  choking on food points to neck, suprasternal notch where still has "tickle"  Sleeping fine  on 2lpm hs but no amb 02  rec Prednisone 10 mg take  4 each am x 2 days,   2 each am x 2 days,  1 each am x 2 days and stop  Try prilosec otc 20mg   Take 30-60 min before first meal of the day and Pepcid ac (famotidine) 20 mg one @  bedtime until cough/choking are completely gone for at least a week without the need for cough suppression> did not do  Please see patient coordinator before you leave today  to schedule ambulatory 02 titration to see if eligible for POC> did not obtain portable 02  GERD diet        12/26/2016  f/u ov/Charmayne Odell re:  COPD GOLD III symb/spiriva / not taking ppi/h2hs as instructed  Chief Complaint  Patient presents with  . Follow-up    Pt states breathing has been worse and she has had sore throat recently.  She also c/o non prod cough. She is using her albuterol inhaler 3 x  per wk and neb with albuterol 2 x per wk on average.   sleeps fine on 2lpm s am cough / then cough starts up dry feels choked / globus senstiona/ assoc With watery rhinitis Prednisone always helps / not using albuterol daily / never at hs  Doe=  MMRC3 = can't walk 100 yards even at a slow pace at a flat grade s stopping due to sob  s 02  rec Pantoprazole (protonix) 40 mg   Take  30-60 min before first meal of the day and Pepcid (famotidine)  20 mg one @ bedtime until return to office - this is the best way to tell whether stomach acid is contributing to your problem.   Prednisone 10 mg take  4 each am x 2 days,   2 each am x 2 days,  1 each am x 2 days and stop  Hold norvasc when your blood pressure is running under 110 on the top number  Please see patient coordinator before you leave today  to sort out what is it [facility] needs for portable 02    02/06/17 NP ov: Continue on Symbicort and Spiriva. Get flu shot as discussed Use  oxygen 2 L with activity and at bedtime Order to DME for portable oxygen system Follow med calendar closely and bring to each visit     05/11/2017  f/u ov/Aftyn Nott re:  Copd  GOLD III/ 02 at hs - never got the amb 02 requested at last ov   Chief Complaint  Patient presents with  . Follow-up    Breathing is overall doing well. She has some non prod cough- relates to weather. She ratrely uses her albuterol inhaler or neb.    sleeping ok  2lpm hs     Can push buggy around around wm = MMRC2 = can't walk a nl pace on a flat grade s sob but does fine slow and flat s 02 and rare need for any saba  While maint on symb 160 2bid and spiriva smi  No obvious day to day or daytime variability or assoc excess/ purulent sputum or mucus plugs or hemoptysis or cp or chest tightness, subjective wheeze or overt sinus or hb symptoms. No unusual exposure hx or h/o childhood pna/ asthma or knowledge of premature birth.  Sleeping ok flat without nocturnal  or early am exacerbation  of respiratory  c/o's or need for noct saba. Also denies any obvious fluctuation of symptoms with weather or environmental changes or other aggravating or alleviating factors except as outlined above   Current Allergies, Complete Past Medical History, Past Surgical History, Family History, and Social History were reviewed in [facility] electronic medical record.  ROS  The following are not active complaints unless bolded Hoarseness, sore throat, dysphagia, dental problems, itching, sneezing,  nasal congestion or discharge of excess mucus or purulent secretions, ear ache,   fever, chills, sweats, unintended wt loss or wt gain, classically pleuritic or exertional cp,  orthopnea pnd or leg swelling, presyncope, palpitations, abdominal pain, anorexia, nausea, vomiting, diarrhea  or change in bowel habits or change in bladder habits, change in stools or change in urine, dysuria, hematuria,  rash, arthralgias, visual complaints, headache, numbness, weakness or ataxia or problems with walking or coordination,  change in mood/affect or memory.        Current Meds  Medication Sig  . albuterol (PROAIR HFA) 108 (90  Base) MCG/ACT inhaler 2 puffs every 4 hours as needed only  if your can't  catch your breath  . albuterol (PROVENTIL) (2.5 MG/3ML) 0.083% nebulizer solution Take 2.5 mg by nebulization every 6 (six) hours as needed for wheezing or shortness of breath.  Marland Kitchen amLODipine (NORVASC) 5 MG tablet Take 5 mg by mouth daily.  . budesonide-formoterol (SYMBICORT) 160-4.5 MCG/ACT inhaler Inhale 2 puffs into the lungs 2 (two) times daily.  . famotidine (PEPCID) 20 MG tablet TAKE 1 TABLET BY MOUTH AT BEDTIME  . irbesartan-hydrochlorothiazide (AVALIDE) 150-12.5 MG tablet Take 1 tablet by mouth daily.  Marland Kitchen nystatin (MYCOSTATIN) 100000 UNIT/ML suspension 1 tsp 4 x daily  . OXYGEN 2lpm with sleep  [facility] [place]  . pantoprazole (PROTONIX) 40 MG tablet TAKE 1 TABLET 30 TO 60 MINUTES BEFORE FIRST MEAL OF DAY  . potassium chloride (K-DUR,KLOR-CON) 10 MEQ tablet Take 10 mEq by mouth daily.  . Tiotropium Bromide Monohydrate (SPIRIVA RESPIMAT) 2.5 MCG/ACT AERS Inhale 2 puffs into the lungs daily.  . Vitamin D, Ergocalciferol, (DRISDOL) 50000 units CAPS capsule Take 50,000 Units by mouth every 7 (seven) days.                    Objective:   Physical Exam  amb wf nad   Wt Readings from Last 3 Encounters:  05/11/17 138 lb 12.8 oz (63 kg)  02/06/17 137 lb 3.2 oz (62.2 kg)  12/26/16 136 lb (61.7 kg)     Vital signs reviewed - Note on arrival 02 sats  96% on RA   HEENT: nl dentition, turbinates bilaterally, and oropharynx. Nl external ear canals without cough reflex   NECK :  without JVD/Nodes/TM/ nl carotid upstrokes bilaterally   LUNGS: no acc muscle use,  slt barrel contour chest/ hyper resonant to percucussion, distant bs with min end exp wheeze  CV:  RRR  no s3 or murmur or increase in P2, and no edema   ABD:  soft and nontender with nl inspiratory excursion in the supine position. No bruits or organomegaly appreciated, bowel sounds nl  MS:  Nl gait/ ext warm without deformities, calf  tenderness, cyanosis or clubbing No obvious joint restrictions   SKIN: warm and dry without lesions    NEURO:  alert, approp, nl sensorium with  no motor or cerebellar deficits apparent.                 Assessment & Plan:

## 2017-05-12 ENCOUNTER — Encounter: Payer: Self-pay | Admitting: Internal Medicine

## 2017-05-12 ENCOUNTER — Other Ambulatory Visit: Payer: Self-pay | Admitting: Internal Medicine

## 2017-05-12 NOTE — Assessment & Plan Note (Signed)
02 rx since 2007  - as of initial pulmonary eval 04/11/2016 = 2lpm hs only - 04/11/2016   Walked RA  2 laps @ 185 ft each stopped due to  Sob but no desat at nl  pace  -  05/11/2017 Patient Saturations on Room Air at Rest = 94% > while Ambulating = 85% And on  2 Liters of oxygen while Ambulating = 95%  As of 05/11/2017 >    2lpm  Hs only and 2lpm with exertion > ordered

## 2017-05-12 NOTE — Assessment & Plan Note (Addendum)
Quit 2009 Spirometry 04/11/2016  FEV1 0.75 (30%)  Ratio 43 p am spiriva dpi/ sym 160  With classic curvature  -04/11/2016  changed to spiriva respimat   12/26/2016  After extensive coaching HFA effectiveness =  90%    I had an extended discussion with the patient reviewing all relevant studies completed to date and  lasting 15 to 20 minutes of a 25 minute visit on the following ongoing concerns:   Doing better on consistent  triple rx and if stays free of exac consider just lama/laba next ov as acting more like a GOLD Group B now  But probably would benefit from amb 02 (see separate a/p)   Formulary restrictions will be an ongoing challenge for the forseable future and I would be happy to pick an alternative if the pt will first  provide me a list of them but pt  will need to return here for training for any new device that is required eg dpi vs hfa vs respimat.    In meantime we can always provide samples so the patient never runs out of any needed respiratory medications.   Each maintenance medication was reviewed in detail including most importantly the difference between maintenance and as needed and under what circumstances the prns are to be used.  Please see AVS for specific  Instructions which are unique to this visit and I personally typed out  which were reviewed in detail in writing with the patient and a copy provided.

## 2017-11-12 ENCOUNTER — Ambulatory Visit: Payer: Medicare Other | Admitting: Internal Medicine

## 2017-11-20 ENCOUNTER — Ambulatory Visit: Payer: Medicare Other | Admitting: Internal Medicine

## 2017-11-23 ENCOUNTER — Ambulatory Visit (INDEPENDENT_AMBULATORY_CARE_PROVIDER_SITE_OTHER)
Admission: RE | Admit: 2017-11-23 | Discharge: 2017-11-23 | Disposition: A | Discharge status: Home / Self Care | Payer: Medicare Other | Source: Ambulatory Visit | Attending: Internal Medicine | Admitting: Internal Medicine

## 2017-11-23 ENCOUNTER — Telehealth: Payer: Self-pay | Admitting: Internal Medicine

## 2017-11-23 ENCOUNTER — Encounter: Payer: Self-pay | Admitting: Internal Medicine

## 2017-11-23 ENCOUNTER — Other Ambulatory Visit (INDEPENDENT_AMBULATORY_CARE_PROVIDER_SITE_OTHER): Payer: Medicare Other

## 2017-11-23 ENCOUNTER — Ambulatory Visit (INDEPENDENT_AMBULATORY_CARE_PROVIDER_SITE_OTHER): Payer: Medicare Other | Admitting: Internal Medicine

## 2017-11-23 VITALS — BP 124/80 | HR 96 | Ht 64.5 in | Wt 139.0 lb

## 2017-11-23 DIAGNOSIS — J9611 Chronic respiratory failure with hypoxia: Secondary | ICD-10-CM

## 2017-11-23 DIAGNOSIS — R0609 Other forms of dyspnea: Secondary | ICD-10-CM

## 2017-11-23 DIAGNOSIS — J449 Chronic obstructive pulmonary disease, unspecified: Secondary | ICD-10-CM | POA: Diagnosis not present

## 2017-11-23 LAB — BRAIN NATRIURETIC PEPTIDE: PRO B NATRI PEPTIDE: 164 pg/mL — AB (ref 0.0–100.0)

## 2017-11-23 LAB — CBC WITH DIFFERENTIAL/PLATELET
Basophils Absolute: 0.1 10*3/uL (ref 0.0–0.1)
Basophils Relative: 0.8 % (ref 0.0–3.0)
EOS ABS: 0.1 10*3/uL (ref 0.0–0.7)
EOS PCT: 1.6 % (ref 0.0–5.0)
HCT: 41.1 % (ref 36.0–46.0)
HEMOGLOBIN: 13.9 g/dL (ref 12.0–15.0)
LYMPHS ABS: 1.1 10*3/uL (ref 0.7–4.0)
Lymphocytes Relative: 11.4 % — ABNORMAL LOW (ref 12.0–46.0)
MCHC: 33.9 g/dL (ref 30.0–36.0)
MCV: 91.6 fl (ref 78.0–100.0)
MONO ABS: 0.4 10*3/uL (ref 0.1–1.0)
Monocytes Relative: 4.6 % (ref 3.0–12.0)
NEUTROS PCT: 81.6 % — AB (ref 43.0–77.0)
Neutro Abs: 7.7 10*3/uL (ref 1.4–7.7)
Platelets: 453 10*3/uL — ABNORMAL HIGH (ref 150.0–400.0)
RBC: 4.48 Mil/uL (ref 3.87–5.11)
RDW: 13.5 % (ref 11.5–15.5)
WBC: 9.5 10*3/uL (ref 4.0–10.5)

## 2017-11-23 LAB — BASIC METABOLIC PANEL
BUN: 11 mg/dL (ref 6–23)
CO2: 25 meq/L (ref 19–32)
CREATININE: 0.77 mg/dL (ref 0.40–1.20)
Calcium: 9.9 mg/dL (ref 8.4–10.5)
Chloride: 103 mEq/L (ref 96–112)
GFR: 79.25 mL/min (ref >60.00)
Glucose, Bld: 110 mg/dL — ABNORMAL HIGH (ref 70–99)
Potassium: 3.9 mEq/L (ref 3.5–5.1)
SODIUM: 138 meq/L (ref 135–145)

## 2017-11-23 LAB — TSH: TSH: 1.58 u[IU]/mL (ref 0.35–4.50)

## 2017-11-23 MED ORDER — BUDESONIDE-FORMOTEROL FUMARATE 160-4.5 MCG/ACT IN AERO: 2.0000 | INHALATION_SPRAY | Freq: Two times a day (BID) | RESPIRATORY_TRACT | 0 refills | Status: DC

## 2017-11-23 MED ORDER — FAMOTIDINE 20 MG PO TABS: 20.0000 mg | ORAL_TABLET | Freq: Every day | ORAL | 2 refills | Status: DC

## 2017-11-23 MED ORDER — TIOTROPIUM BROMIDE MONOHYDRATE 2.5 MCG/ACT IN AERS: INHALATION_SPRAY | RESPIRATORY_TRACT | 0 refills | Status: DC

## 2017-11-23 MED ORDER — PREDNISONE 10 MG PO TABS: ORAL_TABLET | ORAL | 0 refills | Status: DC

## 2017-11-23 MED ORDER — PANTOPRAZOLE SODIUM 40 MG PO TBEC: DELAYED_RELEASE_TABLET | ORAL | 2 refills | Status: DC

## 2017-11-23 NOTE — Patient Instructions (Addendum)
Restart protonix 40 mg Take 30-60 min before first meal of the day and continue pepcid 20 mg at bedtime  Prednisone 10 mg take  4 each am x 2 days,   2 each am x 2 days,  1 each am x 2 days and stop    Please remember to go to the lab and x-ray department downstairs in the basement  for your tests - we will call you with the results when they are available.     Please schedule a follow up office visit in 4 weeks, sooner if needed  - next ov needs alpha one screening    .

## 2017-11-23 NOTE — Progress Notes (Signed)
Subjective:    Patient ID: Emma Hudson, female    DOB: 15-Nov-1950,    MRN: [id number]   Brief patient profile:  61 yowf quit smoking 2009 with symbicort/spiriva dpi  dep since 2007 with dx of copd and no change in ex tol on 160 2bid and 2lpm hs then admitted to [facility] with ? CAP but not back to baseline so self referred to pulmonary clinic 04/11/2016    History of Present Illness  04/11/2016 1st [facility] office visit/ Wert   Chief Complaint  Patient presents with  . Pulmonary Consult    Self referral. Pt states she was dxed with COPD in 2010.  Pt states she had PNA approx 5 wks ago, and her breathing is not quite back to her normal baseline. She had already been on 2lpm o2 at bedtime since April 2017, but when she was discharged from hospital recently they advised her to use 8lpm 24/7. She is only using with sleep though, and feels daytime o2 is not needed.     90% recovered from admit still weak  Doe = MMRC2 = can't walk a nl pace on a flat grade s sob but does fine slow and flat eg  shopping ok with buggy  Has proventil rarely, not using at  all the neb  rec Plan A = Automatic = symbicort 160 Take 2 puffs first thing in am and then another 2 puffs about 12 hours later                                     spiriva respimat 2 each am  Plan B = Backup Only use your albuterol as a rescue medication  Plan C = Crisis - only use your albuterol nebulizer if you first try Plan B      08/21/2016  f/u ov/Wert re: copd III on symb/spiriva / declined rehab "too far to travel"  Chief Complaint  Patient presents with  . Follow-up    Pt c/o worsening SOB after having influenza 5 wks ago. She states she is having a "dry, choking cough".  She states she is using albuterol inhaler 2 x per wk and neb with albuterol at least once per day on average.   since viral uri doe across the room and new c/o  choking on food points to neck, suprasternal notch where still has "tickle"  Sleeping fine  on 2lpm hs but no amb 02  rec Prednisone 10 mg take  4 each am x 2 days,   2 each am x 2 days,  1 each am x 2 days and stop  Try prilosec otc 20mg   Take 30-60 min before first meal of the day and Pepcid ac (famotidine) 20 mg one @  bedtime until cough/choking are completely gone for at least a week without the need for cough suppression> did not do  Please see patient coordinator before you leave today  to schedule ambulatory 02 titration to see if eligible for POC> did not obtain portable 02  GERD diet        12/26/2016  f/u ov/Wert re:  COPD GOLD III symb/spiriva / not taking ppi/h2hs as instructed  Chief Complaint  Patient presents with  . Follow-up    Pt states breathing has been worse and she has had sore throat recently.  She also c/o non prod cough. She is using her albuterol inhaler 3 x  per wk and neb with albuterol 2 x per wk on average.   sleeps fine on 2lpm s am cough / then cough starts up dry feels choked / globus senstiona/ assoc With watery rhinitis Prednisone always helps / not using albuterol daily / never at hs  Doe=  MMRC3 = can't walk 100 yards even at a slow pace at a flat grade s stopping due to sob  s 02  rec Pantoprazole (protonix) 40 mg   Take  30-60 min before first meal of the day and Pepcid (famotidine)  20 mg one @ bedtime until return to office - this is the best way to tell whether stomach acid is contributing to your problem.   Prednisone 10 mg take  4 each am x 2 days,   2 each am x 2 days,  1 each am x 2 days and stop  Hold norvasc when your blood pressure is running under 110 on the top number  Please see patient coordinator before you leave today  to sort out what is it [facility] needs for portable 02    02/06/17 NP ov: Continue on Symbicort and Spiriva. Get flu shot as discussed Use  oxygen 2 L with activity and at bedtime Order to DME for portable oxygen system Follow med calendar closely and bring to each visit     05/11/2017  f/u ov/Wert re:  Copd  GOLD III/ 02 at hs - never got the amb 02 requested at last ov   Chief Complaint  Patient presents with  . Follow-up    Breathing is overall doing well. She has some non prod cough- relates to weather. She ratrely uses her albuterol inhaler or neb.   sleeping ok  2lpm hs     Can push buggy around around wm = MMRC2 = can't walk a nl pace on a flat grade s sob but does fine slow and flat s 02 and rare need for any saba  While maint on symb 160 2bid and spiriva smi rec No change rx   11/23/2017  f/u ov/Wert re: copd GOLD III/ 02 2lpm at hs / most of the time during the day when active 2lpm Chief Complaint  Patient presents with  . Follow-up    Breathing has been worse over the past month. She states it's due to the hot weather. She gets SOB "with any kind of movement at all"- out of breath walking from lobby to exam room today. She has some chest congestion and non prod cough.  She is using her albuterol inhaler 2 x per wk on and neb once per wk on average.   Dyspnea:  Abrupt Onset mid June 2019 assoc dry cough/ sneeze lots vick's spray for nose/ not disturbing sleep Doe = MMRC3 = can't walk 100 yards even at a slow pace at a flat grade s stopping due to sob  Worse since stopped gerd rx "I don't think I need it"      SABA use: not much better p hfa which likes more than the neb though note Ti very short (see copd a/p)   No obvious day to day or daytime variability or assoc excess/ purulent sputum or mucus plugs or hemoptysis or cp or chest tightness, subjective wheeze or overt sinus or hb symptoms.   Sleeping: 2lpm flat/ 1 pillow without nocturnal  or early am exacerbation  of respiratory  c/o's or need for noct saba. Also denies any obvious fluctuation of symptoms with weather or  environmental changes or other aggravating or alleviating factors except as outlined above   No unusual exposure hx or h/o childhood pna/ asthma or knowledge of premature birth.  Current Allergies, Complete Past  Medical History, Past Surgical History, Family History, and Social History were reviewed in [employer] [facility] record.  ROS  The following are not active complaints unless bolded Hoarseness, sore throat, dysphagia, dental problems, itching, sneezing,  nasal congestion or discharge of excess mucus or purulent secretions, ear ache,   fever, chills, sweats, unintended wt loss or wt gain, classically pleuritic or exertional cp,  orthopnea pnd or arm/hand swelling  or leg swelling, presyncope, palpitations, abdominal pain, anorexia, nausea, vomiting, diarrhea  or change in bowel habits or change in bladder habits, change in stools or change in urine, dysuria, hematuria,  rash, arthralgias, visual complaints, headache, numbness, weakness or ataxia or problems with walking or coordination,  change in mood or  memory.        Current Meds  Medication Sig  . albuterol (PROAIR HFA) 108 (90 Base) MCG/ACT inhaler 2 puffs every 4 hours as needed only  if your can't catch your breath  . albuterol (PROVENTIL) (2.5 MG/3ML) 0.083% nebulizer solution Take 2.5 mg by nebulization every 6 (six) hours as needed for wheezing or shortness of breath.  Marland Kitchen amLODipine (NORVASC) 5 MG tablet Take 5 mg by mouth daily.  . budesonide-formoterol (SYMBICORT) 160-4.5 MCG/ACT inhaler Inhale 2 puffs into the lungs 2 (two) times daily.  . irbesartan-hydrochlorothiazide (AVALIDE) 150-12.5 MG tablet Take 1 tablet by mouth daily.  Marland Kitchen nystatin (MYCOSTATIN) 100000 UNIT/ML suspension 1 tsp 4 x daily  . OXYGEN 2lpm with sleep and exertion [facility] [place], [place]  . potassium chloride (K-DUR,KLOR-CON) 10 MEQ tablet Take 10 mEq by mouth daily.  . Tiotropium Bromide Monohydrate (SPIRIVA RESPIMAT) 2.5 MCG/ACT AERS INHALE 2 PUFFS INTO  THE  LUNGS EVERY DAY  . Vitamin D, Ergocalciferol, (DRISDOL) 50000 units CAPS capsule Take 50,000 Units by mouth every 7 (seven) days.  . [  budesonide-formoterol (SYMBICORT) 160-4.5 MCG/ACT inhaler  Inhale 2 puffs into the lungs 2 (two) times daily.  .     .    .                         Objective:   Physical Exam  amb wf nad   11/23/2017       139   05/11/17 138 lb 12.8 oz (63 kg)  02/06/17 137 lb 3.2 oz (62.2 kg)  12/26/16 136 lb (61.7 kg)     Vital signs reviewed - Note on arrival 02 sats  98% on RA right after taking off 02  and 94% p 5 min at rest     HEENT: nl dentition / oropharynx. Nl external ear canals without cough reflex - moderate bilateral non-specific turbinate edema     NECK :  without JVD/Nodes/TM/ nl carotid upstrokes bilaterally   LUNGS: no acc muscle use,  Mod barrel  contour chest wall with bilateral  Distant bs s audible wheeze and  without cough on insp or exp maneuver and mod   Hyperresonant  to  percussion bilaterally     CV:  RRR  no s3 or murmur or increase in P2, and no edema   ABD:  soft and nontender with pos mid insp Hoover's  in the supine position. No bruits or organomegaly appreciated, bowel sounds nl  MS:   Nl gait/  ext warm without  deformities, calf tenderness, cyanosis or clubbing No obvious joint restrictions   SKIN: warm and dry without lesions    NEURO:  alert, approp, nl sensorium with  no motor or cerebellar deficits apparent.         CXR PA and Lateral:   11/23/2017 :    I personally reviewed images and agree with radiology impression as follows:   1. Similar findings of lung hyperexpansion and bronchitic change without superimposed acute cardiopulmonary disease. 2. Focal eventration involving the anteromedial aspect the left hemidiaphragm is unchanged compared to the 04/2016 examination.   Labs ordered/ reviewed:      Chemistry      Component Value Date/Time   NA 138 11/23/2017 1147   K 3.9 11/23/2017 1147   CL 103 11/23/2017 1147   CO2 25 11/23/2017 1147   BUN 11 11/23/2017 1147   CREATININE 0.77 11/23/2017 1147      Component Value Date/Time   CALCIUM 9.9 11/23/2017 1147          Lab  Results  Component Value Date   WBC 9.5 11/23/2017   HGB 13.9 11/23/2017   HCT 41.1 11/23/2017   MCV 91.6 11/23/2017   PLT 453.0 (H) 11/23/2017       EOS                                                              0.1                                     11/23/2017       Lab Results  Component Value Date   TSH 1.58 11/23/2017     Lab Results  Component Value Date   PROBNP 164.0 (H) 11/23/2017           Labs ordered 11/23/2017  Allergy profile                  Assessment & Plan:

## 2017-11-23 NOTE — Telephone Encounter (Signed)
Called and spoke with patients husband. Advised that medication has been sent. Nothing further needed.

## 2017-11-24 ENCOUNTER — Encounter: Payer: Self-pay | Admitting: Internal Medicine

## 2017-11-24 NOTE — Assessment & Plan Note (Signed)
No evidence anemia or thyroid or kidney dz - see copd

## 2017-11-24 NOTE — Assessment & Plan Note (Signed)
02 rx since 2007  - as of initial pulmonary eval 04/11/2016 = 2lpm hs only - 04/11/2016   Walked RA  2 laps @ 185 ft each stopped due to  Sob but no desat at nl  pace  -  05/11/2017 Patient Saturations on Room Air at Rest = 94% > while Ambulating = 85% And on  2 Liters of oxygen while Ambulating = 95%   As of 11/23/2017 >    2lpm  Hs only and 2lpm with exertion

## 2017-11-24 NOTE — Assessment & Plan Note (Signed)
Quit 2009 Spirometry 04/11/2016  FEV1 0.75 (30%)  Ratio 43 p am spiriva dpi/ sym 160  With classic curvature  -04/11/2016  changed to spiriva respimat   - 11/23/2017  After extensive coaching inhaler device  effectiveness =    75% from a baseline of < 50% (short Ti)    DDX of  difficult airways management almost all start with A and  include Adherence, Ace Inhibitors, Acid Reflux, Active Sinus Disease, Alpha 1 Antitripsin deficiency, Anxiety masquerading as Airways dz,  ABPA,  Allergy(esp in young), Aspiration (esp in elderly), Adverse effects of meds,  Active smokers, A bunch of PE's (a small clot burden can't cause this syndrome unless there is already severe underlying pulm or vascular dz with poor reserve) plus two Bs  = Bronchiectasis and Beta blocker use..and one C= CHF   Adherence is always the initial "prime suspect" and is a multilayered concern that requires a "trust but verify" approach in every patient - starting with knowing how to use medications, especially inhalers, correctly, keeping up with refills and understanding the fundamental difference between maintenance and prns vs those medications only taken for a very short course and then stopped and not refilled.  - see hfa / smi teaching  - return with all meds in hand using a trust but verify approach to confirm accurate Medication  Reconciliation The principal here is that until we are certain that the  patients are doing what we've asked, it makes no sense to ask them to do more.    ? Acid (or non-acid) GERD > always difficult to exclude as up to 75% of pts in some series report no assoc GI/ Heartburn symptoms and she is worse since stopped > rec resume  max (24h)  acid suppression and diet restrictions/ reviewed and instructions given in writing.    ? Allergy  / asthma > send profile, Prednisone 10 mg take  4 each am x 2 days,   2 each am x 2 days,  1 each am x 2 days and stop   ? Alpha one AT def > send screen next ov   ? chf >  bnp in the indeterminate range but probably ok    I had an extended discussion with the patient reviewing all relevant studies completed to date and  lasting 15 to 20 minutes of a 25 minute visit    See device teaching which extended face to face time for this visit.  Each maintenance medication was reviewed in detail including emphasizing most importantly the difference between maintenance and prns and under what circumstances the prns are to be triggered using an action plan format that is not reflected in the computer generated alphabetically organized AVS which I have not found useful in most complex patients, especially with respiratory illnesses  Please see AVS for specific instructions unique to this visit that I personally wrote and verbalized to the the pt in detail and then reviewed with pt  by my nurse highlighting any  changes in therapy recommended at today's visit to their plan of care.

## 2017-11-26 LAB — RESPIRATORY ALLERGY PROFILE REGION II ~~LOC~~
Allergen, A. alternata, m6: <0.1 kU/L
Allergen, Cedar tree, t12: <0.1 kU/L
Allergen, Comm Silver Birch, t9: <0.1 kU/L
Allergen, Cottonwood, t14: <0.1 kU/L
Allergen, D pternoyssinus,d7: <0.1 kU/L
Allergen, Mouse Urine Protein, e78: <0.1 kU/L
Allergen, Mulberry, t76: <0.1 kU/L
Allergen, P. notatum, m1: <0.1 kU/L
Aspergillus fumigatus, m3: <0.1 kU/L
CLASS: 0
CLASS: 0
CLASS: 0
CLASS: 0
CLASS: 0
CLASS: 0
CLASS: 0
CLASS: 0
CLASS: 0
CLASS: 0
CLASS: 0
Cat Dander: <0.1 kU/L
Class: 0
Class: 0
Class: 0
Class: 0
Class: 0
Class: 0
Class: 0
Class: 0
Class: 0
Class: 0
Class: 0
Class: 0
Class: 0
D. farinae: <0.1 kU/L
IgE (Immunoglobulin E), Serum: 23 kU/L (ref <=114)
Johnson Grass: <0.1 kU/L
Pecan/Hickory Tree IgE: <0.1 kU/L
Rough Pigweed  IgE: <0.1 kU/L
Timothy Grass: <0.1 kU/L

## 2017-11-26 LAB — INTERPRETATION:

## 2017-11-26 NOTE — Progress Notes (Signed)
Already LMTCB

## 2017-11-26 NOTE — Progress Notes (Signed)
LMTCB

## 2017-11-27 NOTE — Progress Notes (Signed)
Spoke with pt and notified of results per Dr. Wert. Pt verbalized understanding and denied any questions. 

## 2017-12-25 ENCOUNTER — Other Ambulatory Visit: Payer: Medicare Other

## 2017-12-25 ENCOUNTER — Ambulatory Visit (INDEPENDENT_AMBULATORY_CARE_PROVIDER_SITE_OTHER): Payer: Medicare Other | Admitting: Internal Medicine

## 2017-12-25 ENCOUNTER — Encounter: Payer: Self-pay | Admitting: Internal Medicine

## 2017-12-25 VITALS — BP 120/68 | HR 86 | Ht 63.0 in | Wt 134.0 lb

## 2017-12-25 DIAGNOSIS — J449 Chronic obstructive pulmonary disease, unspecified: Secondary | ICD-10-CM

## 2017-12-25 DIAGNOSIS — J9611 Chronic respiratory failure with hypoxia: Secondary | ICD-10-CM | POA: Diagnosis not present

## 2017-12-25 MED ORDER — FLUTICASONE-UMECLIDIN-VILANT 100-62.5-25 MCG/INH IN AEPB: 1.0000 | INHALATION_SPRAY | Freq: Every day | RESPIRATORY_TRACT | 11 refills | Status: DC

## 2017-12-25 NOTE — Patient Instructions (Addendum)
Please remember to go to the lab department downstairs in the basement  for your tests - we will call you with the results when they are available.       If you are satisfied with your treatment plan,  let your doctor know and he/she can either refill your medications or you can return here when your prescription runs out.     If in any way you are not 100% satisfied,  please tell us.  If 100% better, tell your friends!  Pulmonary follow up is as needed   

## 2017-12-25 NOTE — Progress Notes (Signed)
Subjective:    Patient ID: Andres EgeSabra Bertone, female    DOB: 01/28/1951,    MRN: [id number]   Brief patient profile:  [id number] yowf quit smoking 2009 with symbicort/spiriva dpi  dep since 2007 with dx of copd and no change in ex tol on 160 2bid and 2lpm hs then admitted to [facility] [place] with ? CAP but not back to baseline so self referred to pulmonary clinic 04/11/2016    History of Present Illness  04/11/2016 1st  Pulmonary office visit/ Valaria Kohut   Chief Complaint  Patient presents with  . Pulmonary Consult    Self referral. Pt states she was dxed with COPD in 2010.  Pt states she had PNA approx 5 wks ago, and her breathing is not quite back to her normal baseline. She had already been on 2lpm o2 at bedtime since April 2017, but when she was discharged from hospital recently they advised her to use 8lpm 24/7. She is only using with sleep though, and feels daytime o2 is not needed.     90% recovered from admit still weak  Doe = MMRC2 = can't walk a nl pace on a flat grade s sob but does fine slow and flat eg  shopping ok with buggy  Has proventil rarely, not using at  all the neb  rec Plan A = Automatic = symbicort 160 Take 2 puffs first thing in am and then another 2 puffs about 12 hours later                                     spiriva respimat 2 each am  Plan B = Backup Only use your albuterol as a rescue medication  Plan C = Crisis - only use your albuterol nebulizer if you first try Plan B      08/21/2016  f/u ov/Krishna Heuer re: copd III on symb/spiriva / declined rehab "too far to travel"  Chief Complaint  Patient presents with  . Follow-up    Pt c/o worsening SOB after having influenza 5 wks ago. She states she is having a "dry, choking cough".  She states she is using albuterol inhaler 2 x per wk and neb with albuterol at least once per day on average.   since viral uri doe across the room and new c/o  choking on food points to neck, suprasternal notch where still has "tickle"  Sleeping fine  on 2lpm hs but no amb 02  rec Prednisone 10 mg take  4 each am x 2 days,   2 each am x 2 days,  1 each am x 2 days and stop  Try prilosec otc 20mg   Take 30-60 min before first meal of the day and Pepcid ac (famotidine) 20 mg one @  bedtime until cough/choking are completely gone for at least a week without the need for cough suppression> did not do  Please see patient coordinator before you leave today  to schedule ambulatory 02 titration to see if eligible for POC> did not obtain portable 02  GERD diet        12/26/2016  f/u ov/Haaris Metallo re:  COPD GOLD III symb/spiriva / not taking ppi/h2hs as instructed  Chief Complaint  Patient presents with  . Follow-up    Pt states breathing has been worse and she has had sore throat recently.  She also c/o non prod cough. She is using her albuterol inhaler 3 x  per wk and neb with albuterol 2 x per wk on average.   sleeps fine on 2lpm s am cough / then cough starts up dry feels choked / globus senstiona/ assoc With watery rhinitis Prednisone always helps / not using albuterol daily / never at hs  Doe=  MMRC3 = can't walk 100 yards even at a slow pace at a flat grade s stopping due to sob  s 02  rec Pantoprazole (protonix) 40 mg   Take  30-60 min before first meal of the day and Pepcid (famotidine)  20 mg one @ bedtime until return to office - this is the best way to tell whether stomach acid is contributing to your problem.   Prednisone 10 mg take  4 each am x 2 days,   2 each am x 2 days,  1 each am x 2 days and stop  Hold norvasc when your blood pressure is running under 110 on the top number  Please see patient coordinator before you leave today  to sort out what is it [facility] needs for portable 02    02/06/17 NP ov: Continue on Symbicort and Spiriva. Get flu shot as discussed Use  oxygen 2 L with activity and at bedtime Order to DME for portable oxygen system Follow med calendar closely and bring to each visit         11/23/2017  f/u ov/Tammi Boulier re:  copd GOLD III/ 02 2lpm at hs / most of the time during the day when active 2lpm Chief Complaint  Patient presents with  . Follow-up    Breathing has been worse over the past month. She states it's due to the hot weather. She gets SOB "with any kind of movement at all"- out of breath walking from lobby to exam room today. She has some chest congestion and non prod cough.  She is using her albuterol inhaler 2 x per wk on and neb once per wk on average.   Dyspnea:  Abrupt Onset mid June 2019 assoc dry cough/ sneeze lots vick's spray for nose/ not disturbing sleep Doe = MMRC3 = can't walk 100 yards even at a slow pace at a flat grade s stopping due to sob  Worse since stopped gerd rx "I don't think I need it"   SABA use: not much better p hfa which likes more than the neb though note Ti very short (see copd a/p)  rec Restart protonix 40 mg Take 30-60 min before first meal of the day and continue pepcid 20 mg at bedtime Prednisone 10 mg take  4 each am x 2 days,   2 each am x 2 days,  1 each am x 2 days and stop         12/25/2017  f/u ov/Jarmal Lewelling re: copd GOLD II/ 02 hs and with activity maint now on trelegy  Chief Complaint  Patient presents with  . Follow-up    She is doing better from last ov    Dyspnea:  MMRC2 = can't walk a nl pace on a flat grade s sob but does fine slow and flat pushing a buggy anywhere  Cough: no/ still sneezing Sleeping: fine SABA use: not all now 02: 2lpm at hs and prn daytime     No obvious day to day or daytime variability or assoc excess/ purulent sputum or mucus plugs or hemoptysis or cp or chest tightness, subjective wheeze or overt sinus or hb symptoms.   Sleeping flat /one pillow  without  nocturnal  or early am exacerbation  of respiratory  c/o's or need for noct saba. Also denies any obvious fluctuation of symptoms with weather or environmental changes or other aggravating or alleviating factors except as outlined above   No unusual exposure hx or h/o  childhood pna/ asthma or knowledge of premature birth.  Current Allergies, Complete Past Medical History, Past Surgical History, Family History, and Social History were reviewed in [facility] electronic medical record.  ROS  The following are not active complaints unless bolded Hoarseness, sore throat, dysphagia, dental problems, itching, sneezing,  nasal congestion or discharge of excess mucus or purulent secretions, ear ache,   fever, chills, sweats, unintended wt loss or wt gain, classically pleuritic or exertional cp,  orthopnea pnd or arm/hand swelling  or leg swelling, presyncope, palpitations, abdominal pain, anorexia, nausea, vomiting, diarrhea  or change in bowel habits or change in bladder habits, change in stools or change in urine, dysuria, hematuria,  rash, arthralgias, visual complaints, headache, numbness, weakness or ataxia or problems with walking or coordination,  change in mood or  memory.        Current Meds  Medication Sig  . albuterol (PROAIR HFA) 108 (90 Base) MCG/ACT inhaler 2 puffs every 4 hours as needed only  if your can't catch your breath  . albuterol (PROVENTIL) (2.5 MG/3ML) 0.083% nebulizer solution Take 2.5 mg by nebulization every 6 (six) hours as needed for wheezing or shortness of breath.  Marland Kitchen. amLODipine (NORVASC) 5 MG tablet Take 5 mg by mouth daily.  . famotidine (PEPCID) 20 MG tablet Take 1 tablet (20 mg total) by mouth at bedtime.  . Fluticasone-Umeclidin-Vilant (TRELEGY ELLIPTA) 100-62.5-25 MCG/INH AEPB Inhale 1 puff into the lungs daily.  . irbesartan-hydrochlorothiazide (AVALIDE) 150-12.5 MG tablet Take 1 tablet by mouth daily.  Marland Kitchen. nystatin (MYCOSTATIN) 100000 UNIT/ML suspension 1 tsp 4 x daily  . OXYGEN 2lpm with sleep and exertion [facility] [place]  . pantoprazole (PROTONIX) 40 MG tablet TAKE 1 TABLET 30 TO 60 MINUTES BEFORE FIRST MEAL OF DAY  . potassium chloride (K-DUR,KLOR-CON) 10 MEQ tablet Take 10 mEq by mouth daily.  . Vitamin D,  Ergocalciferol, (DRISDOL) 50000 units CAPS capsule Take 50,000 Units by mouth every 7 (seven) days.  .     .                      Objective:   Physical Exam  amb wf nad   12/25/2017       134  11/23/2017       139   05/11/17 138 lb 12.8 oz (63 kg)  02/06/17 137 lb 3.2 oz (62.2 kg)  12/26/16 136 lb (61.7 kg)    Vital signs reviewed - Note on arrival 02 sats  93% on RA        HEENT: nl dentition / oropharynx. Nl external ear canals without cough reflex -  Mild bilateral non-specific turbinate edema     NECK :  without JVD/Nodes/TM/ nl carotid upstrokes bilaterally   LUNGS: no acc muscle use,  Mild barrel  contour chest wall with bilateral  Distant bs s audible wheeze and  without cough on insp or exp maneuver and mild  Hyperresonant  to  percussion bilaterally     CV:  RRR  no s3 or murmur or increase in P2, and no edema   ABD:  soft and nontender with pos late insp Hoover's  in the supine position. No bruits or organomegaly appreciated, bowel  sounds nl  MS:   Nl gait/  ext warm without deformities, calf tenderness, cyanosis or clubbing No obvious joint restrictions   SKIN: warm and dry without lesions    NEURO:  alert, approp, nl sensorium with  no motor or cerebellar deficits apparent.     Labs ordered 12/25/2017  Alpha one screening                     Assessment & Plan:

## 2017-12-26 ENCOUNTER — Encounter: Payer: Self-pay | Admitting: Internal Medicine

## 2017-12-26 NOTE — Assessment & Plan Note (Signed)
02 rx since 2007  - as of initial pulmonary eval 04/11/2016 = 2lpm hs only - 04/11/2016   Walked RA  2 laps @ 185 ft each stopped due to  Sob but no desat at nl  pace  -  05/11/2017 Patient Saturations on Room Air at Rest = 94% > while Ambulating = 85% And on  2 Liters of oxygen while Ambulating = 95%  As of 12/25/2017 >    2lpm  Hs only and 2lpm with exertion   Adequate control on present rx, reviewed in detail with pt > no change in rx needed

## 2017-12-26 NOTE — Assessment & Plan Note (Addendum)
Quit 2009 Spirometry 04/11/2016  FEV1 0.75 (30%)  Ratio 43 p am spiriva dpi/ sym 160  With classic curvature  -04/11/2016  changed to spiriva respimat   - 11/23/2017  After extensive coaching inhaler device  effectiveness =    75% from a baseline of < 50% (short Ti)  - Allergy profile 11/23/17  >  Eos 0.1 /  IgE  23 RAST neg  - improved as of 12/25/2017 on trelegy > pulmonary f/u prn  - alpha one AT screening 12/25/2017 :    Clearly better on Trelegy rx dpi c/w  Group D in terms of symptom/risk and laba/lama/ICS  therefore appropriate rx at this point.  - The proper method of use, as well as anticipated side effects, of an elipta inhaler are discussed and demonstrated to the patient.     - screening for alpha one def rec > will  do today   I had an extended discussion with the patient reviewing all relevant studies completed to date and  lasting 15 to 20 minutes of a 25 minute visit    See device teaching which extended face to face time for this visit.  Each maintenance medication was reviewed in detail including emphasizing most importantly the difference between maintenance and prns and under what circumstances the prns are to be triggered using an action plan format that is not reflected in the computer generated alphabetically organized AVS which I have not found useful in most complex patients, especially with respiratory illnesses  Please see AVS for specific instructions unique to this visit that I personally wrote and verbalized to the the pt in detail and then reviewed with pt  by my nurse highlighting any  changes in therapy recommended at today's visit to their plan of care.      Pulmonary f/u can be prn

## 2017-12-28 LAB — ALPHA-1 ANTITRYPSIN PHENOTYPE: A-1 Antitrypsin, Ser: 163 mg/dL (ref 83–199)

## 2019-10-29 ENCOUNTER — Ambulatory Visit (INDEPENDENT_AMBULATORY_CARE_PROVIDER_SITE_OTHER): Payer: Medicare Other

## 2019-10-29 ENCOUNTER — Ambulatory Visit (INDEPENDENT_AMBULATORY_CARE_PROVIDER_SITE_OTHER): Payer: Medicare Other | Admitting: Internal Medicine

## 2019-10-29 ENCOUNTER — Other Ambulatory Visit: Payer: Self-pay

## 2019-10-29 ENCOUNTER — Encounter: Payer: Self-pay | Admitting: Internal Medicine

## 2019-10-29 DIAGNOSIS — J449 Chronic obstructive pulmonary disease, unspecified: Secondary | ICD-10-CM | POA: Diagnosis not present

## 2019-10-29 DIAGNOSIS — R05 Cough: Secondary | ICD-10-CM

## 2019-10-29 DIAGNOSIS — R058 Other specified cough: Secondary | ICD-10-CM | POA: Insufficient documentation

## 2019-10-29 DIAGNOSIS — J9611 Chronic respiratory failure with hypoxia: Secondary | ICD-10-CM | POA: Diagnosis not present

## 2019-10-29 MED ORDER — PREDNISONE 10 MG PO TABS: ORAL_TABLET | ORAL | 0 refills | Status: DC

## 2019-10-29 MED ORDER — BREZTRI AEROSPHERE 160-9-4.8 MCG/ACT IN AERO: 2.0000 | INHALATION_SPRAY | Freq: Two times a day (BID) | RESPIRATORY_TRACT | 0 refills | Status: DC

## 2019-10-29 MED ORDER — BREZTRI AEROSPHERE 160-9-4.8 MCG/ACT IN AERO: 2.0000 | INHALATION_SPRAY | Freq: Two times a day (BID) | RESPIRATORY_TRACT | 11 refills | Status: DC

## 2019-10-29 MED ORDER — PANTOPRAZOLE SODIUM 40 MG PO TBEC: 40.0000 mg | DELAYED_RELEASE_TABLET | Freq: Every day | ORAL | 2 refills | Status: DC

## 2019-10-29 MED ORDER — FAMOTIDINE 20 MG PO TABS: ORAL_TABLET | ORAL | 11 refills | Status: DC

## 2019-10-29 NOTE — Patient Instructions (Addendum)
Prednisone 10 mg take  4 each am x 2 days,   2 each am x 2 days,  1 each am x 2 days and stop   Pantoprazole (protonix) 40 mg   Take  30-60 min before first meal of the day and Pepcid (famotidine)  20 mg one @  bedtime until return to office - this is the best way to tell whether stomach acid is contributing to your problem.     Make sure you check your oxygen saturations at highest level of activity to be sure it stays over 90% and adjust upward to maintain this level if needed but remember to turn it back to previous settings when you stop (to conserve your supply).   Plan A = Automatic = Always=    Breztri Take 2 puffs first thing in am and then another 2 puffs about 12 hours later.    stop symbicort and spiriva   Work on inhaler technique:  relax and gently blow all the way out then take a nice smooth deep breath back in, triggering the inhaler at same time you start breathing in.  Hold for up to 5 seconds if you can. Blow out thru nose. Rinse and gargle with water when done      Plan B = Backup (to supplement plan A, not to replace it) Only use your albuterol inhaler as a rescue medication to be used if you can't catch your breath by resting or doing a relaxed purse lip breathing pattern.  - The less you use it, the better it will work when you need it. - Ok to use the inhaler up to 2 puffs  every 4 hours if you must but call for appointment if use goes up over your usual need - Don't leave home without it !!  (think of it like the spare tire for your car)   Plan C = Crisis (instead of Plan B but only if Plan B stops working) - only use your albuterol nebulizer if you first try Plan B and it fails to help > ok to use the nebulizer up to every 4 hours but if start needing it regularly call for immediate appointment  Please remember to go to the  x-ray department  for your tests - we will call you with the results when they are available   Please schedule a follow up office visit in 6  weeks, call sooner if needed in [place] office

## 2019-10-29 NOTE — Assessment & Plan Note (Addendum)
Flared 10/29/2019 off gerd rx with intol to dpi   Upper airway cough syndrome (previously labeled PNDS),  is so named because it's frequently impossible to sort out how much is  CR/sinusitis with freq throat clearing (which can be related to primary GERD)   vs  causing  secondary (" extra esophageal")  GERD from wide swings in gastric pressure that occur with throat clearing, often  promoting self use of mint and menthol lozenges that reduce the lower esophageal sphincter tone and exacerbate the problem further in a cyclical fashion.   These are the same pts (now being labeled as having "irritable larynx syndrome" by some cough centers) who not infrequently have a history of having failed to tolerate ace inhibitors,  dry powder inhalers or biphosphonates or report having atypical/extraesophageal reflux symptoms that don't respond to standard doses of PPI  and are easily confused as having aecopd or asthma flares by even experienced allergists/ pulmonologists (myself included).   rec max rx for gerd/ avoid dpi f/u in 6 weeks in [place] office         Each maintenance medication was reviewed in detail including emphasizing most importantly the difference between maintenance and prns and under what circumstances the prns are to be triggered using an action plan format where appropriate.  Total time for H and P, chart review, counseling, teaching device and generating customized AVS unique to this office visit / charting = 30 min

## 2019-10-29 NOTE — Assessment & Plan Note (Addendum)
02 rx since 2007  - as of initial pulmonary eval 04/11/2016 = 2lpm hs only - 04/11/2016   Walked RA  2 laps @ 185 ft each stopped due to  Sob but no desat at nl  pace  -  05/11/2017 Patient Saturations on Room Air at Rest = 94% > while Ambulating = 85% And on  2 Liters of oxygen while Ambulating = 95%  As of 10/29/2019 >    2lpm  Hs only and prn daytime  with goal of sat s> 90% at all times  Advised: Make sure you check your oxygen saturations at highest level of activity to be sure it stays over 90% and adjust upward to maintain this level if needed but remember to turn it back to previous settings when you stop (to conserve your supply).

## 2019-10-29 NOTE — Progress Notes (Signed)
Subjective:    Patient ID: Emma Hudson, female    DOB: 09-05-1950,    MRN: [id number]   Brief patient profile:  [id number]  MM/quit smoking 2009 with symbicort/spiriva dpi  dep since 2007 with dx of copd and no change in ex tol on 160 2bid and 2lpm hs then admitted to [facility] with ? CAP but not back to baseline so self referred to pulmonary clinic 04/11/2016    History of Present Illness  04/11/2016 1st [facility] office visit/ Emma Hudson   Chief Complaint  Patient presents with  . Pulmonary Consult    Self referral. Pt states she was dxed with COPD in 2010.  Pt states she had PNA approx 5 wks ago, and her breathing is not quite back to her normal baseline. She had already been on 2lpm o2 at bedtime since April 2017, but when she was discharged from hospital recently they advised her to use 8lpm 24/7. She is only using with sleep though, and feels daytime o2 is not needed.     90% recovered from admit still weak  Doe = MMRC2 = can't walk a nl pace on a flat grade s sob but does fine slow and flat eg  shopping ok with buggy  Has proventil rarely, not using at  all the neb  rec Plan A = Automatic = symbicort 160 Take 2 puffs first thing in am and then another 2 puffs about 12 hours later                                     spiriva respimat 2 each am  Plan B = Backup Only use your albuterol as a rescue medication  Plan C = Crisis - only use your albuterol nebulizer if you first try Plan B      08/21/2016  f/u ov/Emma Hudson re: copd III on symb/spiriva / declined rehab "too far to travel"  Chief Complaint  Patient presents with  . Follow-up    Pt c/o worsening SOB after having influenza 5 wks ago. She states she is having a "dry, choking cough".  She states she is using albuterol inhaler 2 x per wk and neb with albuterol at least once per day on average.   since viral uri doe across the room and new c/o  choking on food points to neck, suprasternal notch where still has "tickle"  Sleeping  fine on 2lpm hs but no amb 02  rec Prednisone 10 mg take  4 each am x 2 days,   2 each am x 2 days,  1 each am x 2 days and stop  Try prilosec otc 20mg   Take 30-60 min before first meal of the day and Pepcid ac (famotidine) 20 mg one @  bedtime until cough/choking are completely gone for at least a week without the need for cough suppression> did not do  Please see patient coordinator before you leave today  to schedule ambulatory 02 titration to see if eligible for POC> did not obtain portable 02  GERD diet        12/26/2016  f/u ov/Emma Hudson re:  COPD GOLD III symb/spiriva / not taking ppi/h2hs as instructed  Chief Complaint  Patient presents with  . Follow-up    Pt states breathing has been worse and she has had sore throat recently.  She also c/o non prod cough. She is using her albuterol inhaler 3 x  per wk and neb with albuterol 2 x per wk on average.   sleeps fine on 2lpm s am cough / then cough starts up dry feels choked / globus senstiona/ assoc With watery rhinitis Prednisone always helps / not using albuterol daily / never at hs  Doe=  MMRC3 = can't walk 100 yards even at a slow pace at a flat grade s stopping due to sob  s 02  rec Pantoprazole (protonix) 40 mg   Take  30-60 min before first meal of the day and Pepcid (famotidine)  20 mg one @ bedtime until return to office - this is the best way to tell whether stomach acid is contributing to your problem.   Prednisone 10 mg take  4 each am x 2 days,   2 each am x 2 days,  1 each am x 2 days and stop  Hold norvasc when your blood pressure is running under 110 on the top number  Please see patient coordinator before you leave today  to sort out what is it [facility] needs for portable 02    02/06/17 NP ov: Continue on Symbicort and Spiriva. Get flu shot as discussed Use  oxygen 2 L with activity and at bedtime Order to DME for portable oxygen system Follow med calendar closely and bring to each visit         11/23/2017  f/u  ov/Emma Hudson re: copd GOLD III/ 02 2lpm at hs / most of the time during the day when active 2lpm Chief Complaint  Patient presents with  . Follow-up    Breathing has been worse over the past month. She states it's due to the hot weather. She gets SOB "with any kind of movement at all"- out of breath walking from lobby to exam room today. She has some chest congestion and non prod cough.  She is using her albuterol inhaler 2 x per wk on and neb once per wk on average.   Dyspnea:  Abrupt Onset mid June 2019 assoc dry cough/ sneeze lots vick's spray for nose/ not disturbing sleep Doe = MMRC3 = can't walk 100 yards even at a slow pace at a flat grade s stopping due to sob  Worse since stopped gerd rx "I don't think I need it"   SABA use: not much better p hfa which likes more than the neb though note Ti very short (see copd a/p)  rec Restart protonix 40 mg Take 30-60 min before first meal of the day and continue pepcid 20 mg at bedtime Prednisone 10 mg take  4 each am x 2 days,   2 each am x 2 days,  1 each am x 2 days and stop      10/29/2019  f/u ov/Emma Hudson re:  GOLD II/ 02 hs 2lpm symb/spiriva  [employer] second shot in April 2021 [name] cough off gerd rx  Chief Complaint  Patient presents with  . Follow-up  Dyspnea:  Can't do buggy at [employer] stopping  Cough: dry daytime with occ  choking sensation off gerd rx/ worse on DPI   Sleeping: bed is flat on side one pillow SABA use: once a day / never  02: 2lpm hs    No obvious day to day or daytime variability or assoc excess/ purulent sputum or mucus plugs or hemoptysis or cp or chest tightness, subjective wheeze or overt sinus or hb symptoms.   Sleeping  without nocturnal  or early am exacerbation  of respiratory  c/o's  or need for noct saba. Also denies any obvious fluctuation of symptoms with weather or environmental changes or other aggravating or alleviating factors except as outlined above   No unusual exposure hx or h/o childhood pna/ asthma or  knowledge of premature birth.  Current Allergies, Complete Past Medical History, Past Surgical History, Family History, and Social History were reviewed in [employer] record.  ROS  The following are not active complaints unless bolded Hoarseness, sore throat= globus sensation , dysphagia, dental problems, itching, sneezing,  nasal congestion or discharge of excess mucus or purulent secretions, ear ache,   fever, chills, sweats, unintended wt loss or wt gain, classically pleuritic or exertional cp,  orthopnea pnd or arm/hand swelling  or leg swelling, presyncope, palpitations, abdominal pain, anorexia, nausea, vomiting, diarrhea  or change in bowel habits or change in bladder habits, change in stools or change in urine, dysuria, hematuria,  rash, arthralgias, visual complaints, headache, numbness, weakness or ataxia or problems with walking or coordination,  change in mood or  memory.        Current Meds  Medication Sig  . albuterol (PROAIR HFA) 108 (90 Base) MCG/ACT inhaler 2 puffs every 4 hours as needed only  if your can't catch your breath  . albuterol (PROVENTIL) (2.5 MG/3ML) 0.083% nebulizer solution Take 2.5 mg by nebulization every 6 (six) hours as needed for wheezing or shortness of breath.  Marland Kitchen amLODipine (NORVASC) 5 MG tablet Take 5 mg by mouth daily.  . budesonide-formoterol (SYMBICORT) 160-4.5 MCG/ACT inhaler Inhale 2 puffs into the lungs 2 (two) times daily.  . Lactobacillus-Inulin (CULTURELLE DIGESTIVE DAILY) CAPS Take 1 capsule by mouth. OTC  . omeprazole (PRILOSEC) 20 MG capsule Take 20 mg by mouth daily.  . OXYGEN 2lpm with sleep and exertion [facility] [place]  . potassium chloride (K-DUR,KLOR-CON) 10 MEQ tablet Take 10 mEq by mouth daily.  Marland Kitchen tiotropium (SPIRIVA) 18 MCG inhalation capsule Place 18 mcg into inhaler and inhale in the morning and at bedtime.  . Vitamin D, Ergocalciferol, (DRISDOL) 50000 units CAPS capsule Take 50,000 Units by mouth every  7 (seven) days.                        Objective:   Physical Exam  Hoarse amb wf   10/29/2019       126  12/25/2017       134  11/23/2017       139   05/11/17 138 lb 12.8 oz (63 kg)  02/06/17 137 lb 3.2 oz (62.2 kg)  12/26/16 136 lb (61.7 kg)    Vital signs reviewed  10/29/2019  - Note at rest 02 sats  96% on RA       HEENT : pt wearing mask not removed for exam due to covid - 19 concerns.    NECK :  without JVD/Nodes/TM/ nl carotid upstrokes bilaterally   LUNGS: no acc muscle use,  Mild barrel  contour chest wall with bilateral  Distant bs s audible wheeze and  without cough on insp or exp maneuvers  and mild  Hyperresonant  to  percussion bilaterally     CV:  RRR  no s3 or murmur or increase in P2, and no edema   ABD:  soft and nontender with pos end  insp Hoover's  in the supine position. No bruits or organomegaly appreciated, bowel sounds nl  MS:   Nl gait/  ext warm without deformities, calf tenderness, cyanosis or  clubbing No obvious joint restrictions   SKIN: warm and dry without lesions    NEURO:  alert, approp, nl sensorium with  no motor or cerebellar deficits apparent.        CXR PA and Lateral:   10/29/2019 :    I personally reviewed images and   impression as follows:   Copd only                     Assessment & Plan:

## 2019-10-29 NOTE — Assessment & Plan Note (Addendum)
Quit 2009 Spirometry 04/11/2016  FEV1 0.75 (30%)  Ratio 43 p am spiriva dpi/ sym 160  With classic curvature  -04/11/2016  changed to spiriva respimat   - 11/23/2017  After extensive coaching inhaler device  effectiveness =    75% from a baseline of < 50% (short Ti)  - Allergy profile 11/23/17  >  Eos 0.1 /  IgE  23 RAST neg  - improved as of 12/25/2017 on trelegy > pulmonary f/u prn  - [facility] AT  Screen   12/25/17   MM / 163  - 10/29/2019  After extensive coaching inhaler device,  effectiveness =    75% (short Ti)    Group D in terms of symptom/risk and laba/lama/ICS  therefore appropriate rx at this point >>>  breztri trial with pred x 6 days for mild flare of cough ? aecopd vs uacs (see separate a/p)   Advised:  formulary restrictions will be an ongoing challenge for the forseable future and I would be happy to pick an alternative if the pt will first  provide me a list of them -  pt  will need to return here for training for any new device that is required eg dpi vs hfa vs respimat.    In the meantime we can always provide samples so that the patient never runs out of any needed respiratory medications.

## 2019-10-31 NOTE — Progress Notes (Signed)
I called and spoke with the pt and notified of results

## 2019-12-05 ENCOUNTER — Ambulatory Visit: Payer: Medicare Other | Admitting: Internal Medicine

## 2019-12-29 ENCOUNTER — Ambulatory Visit: Payer: Medicare Other | Admitting: Internal Medicine

## 2020-01-28 ENCOUNTER — Other Ambulatory Visit: Payer: Self-pay

## 2020-01-28 ENCOUNTER — Ambulatory Visit (INDEPENDENT_AMBULATORY_CARE_PROVIDER_SITE_OTHER): Payer: Medicare Other | Admitting: Internal Medicine

## 2020-01-28 ENCOUNTER — Encounter: Payer: Self-pay | Admitting: Internal Medicine

## 2020-01-28 DIAGNOSIS — J449 Chronic obstructive pulmonary disease, unspecified: Secondary | ICD-10-CM

## 2020-01-28 DIAGNOSIS — J9611 Chronic respiratory failure with hypoxia: Secondary | ICD-10-CM | POA: Diagnosis not present

## 2020-01-28 NOTE — Assessment & Plan Note (Signed)
02 rx since 2007  - as of initial pulmonary eval 04/11/2016 = 2lpm hs only - 04/11/2016   Walked RA  2 laps @ 185 ft each stopped due to  Sob but no desat at nl  pace  -  05/11/2017 Patient Saturations on Room Air at Rest = 94% > while Ambulating = 85% And on  2 Liters of oxygen while Ambulating = 95%  As of 01/28/2020 >    2lpm  Hs only and prn daytime  with goal of sat s> 90% at all times          Each maintenance medication was reviewed in detail including emphasizing most importantly the difference between maintenance and prns and under what circumstances the prns are to be triggered using an action plan format where appropriate.  Total time for H and P, chart review, counseling, teaching device and generating customized AVS unique to this office visit / charting = 20 min

## 2020-01-28 NOTE — Progress Notes (Signed)
Subjective:    Patient ID: Emma Hudson, female    DOB: 1951-02-23,    MRN: [id number]   Brief patient profile:  8   yowf  MM/quit smoking 2009 with symbicort/spiriva dpi  dep since 2007 with dx of copd and no change in ex tol on 160 2bid and 2lpm hs then admitted to [facility] with ? CAP but not back to baseline so self referred to pulmonary clinic 04/11/2016    History of Present Illness  04/11/2016 1st [facility] office visit/ Emma Hudson   Chief Complaint  Patient presents with  . Pulmonary Consult    Self referral. Pt states she was dxed with COPD in 2010.  Pt states she had PNA approx 5 wks ago, and her breathing is not quite back to her normal baseline. She had already been on 2lpm o2 at bedtime since April 2017, but when she was discharged from hospital recently they advised her to use 8lpm 24/7. She is only using with sleep though, and feels daytime o2 is not needed.     90% recovered from admit still weak  Doe = MMRC2 = can't walk a nl pace on a flat grade s sob but does fine slow and flat eg  shopping ok with buggy  Has proventil rarely, not using at  all the neb  rec Plan A = Automatic = symbicort 160 Take 2 puffs first thing in am and then another 2 puffs about 12 hours later                                     spiriva respimat 2 each am  Plan B = Backup Only use your albuterol as a rescue medication  Plan C = Crisis - only use your albuterol nebulizer if you first try Plan B      08/21/2016  f/u ov/Emma Hudson re: copd III on symb/spiriva / declined rehab "too far to travel"  Chief Complaint  Patient presents with  . Follow-up    Pt c/o worsening SOB after having influenza 5 wks ago. She states she is having a "dry, choking cough".  She states she is using albuterol inhaler 2 x per wk and neb with albuterol at least once per day on average.   since viral uri doe across the room and new c/o  choking on food points to neck, suprasternal notch where still has "tickle"  Sleeping  fine on 2lpm hs but no amb 02  rec Prednisone 10 mg take  4 each am x 2 days,   2 each am x 2 days,  1 each am x 2 days and stop  Try prilosec otc 20mg   Take 30-60 min before first meal of the day and Pepcid ac (famotidine) 20 mg one @  bedtime until cough/choking are completely gone for at least a week without the need for cough suppression> did not do  Please see patient coordinator before you leave today  to schedule ambulatory 02 titration to see if eligible for POC> did not obtain portable 02  GERD diet        12/26/2016  f/u ov/Emma Hudson re:  COPD GOLD III symb/spiriva / not taking ppi/h2hs as instructed  Chief Complaint  Patient presents with  . Follow-up    Pt states breathing has been worse and she has had sore throat recently.  She also c/o non prod cough. She is using her albuterol  inhaler 3 x per wk and neb with albuterol 2 x per wk on average.   sleeps fine on 2lpm s am cough / then cough starts up dry feels choked / globus senstiona/ assoc With watery rhinitis Prednisone always helps / not using albuterol daily / never at hs  Doe=  MMRC3 = can't walk 100 yards even at a slow pace at a flat grade s stopping due to sob  s 02  rec Pantoprazole (protonix) 40 mg   Take  30-60 min before first meal of the day and Pepcid (famotidine)  20 mg one @ bedtime until return to office - this is the best way to tell whether stomach acid is contributing to your problem.   Prednisone 10 mg take  4 each am x 2 days,   2 each am x 2 days,  1 each am x 2 days and stop  Hold norvasc when your blood pressure is running under 110 on the top number  Please see patient coordinator before you leave today  to sort out what is it [facility] needs for portable 02    02/06/17 NP ov: Continue on Symbicort and Spiriva. Get flu shot as discussed Use  oxygen 2 L with activity and at bedtime Order to DME for portable oxygen system Follow med calendar closely and bring to each visit         11/23/2017  f/u  ov/Emma Hudson re: copd GOLD III/ 02 2lpm at hs / most of the time during the day when active 2lpm Chief Complaint  Patient presents with  . Follow-up    Breathing has been worse over the past month. She states it's due to the hot weather. She gets SOB "with any kind of movement at all"- out of breath walking from lobby to exam room today. She has some chest congestion and non prod cough.  She is using her albuterol inhaler 2 x per wk on and neb once per wk on average.   Dyspnea:  Abrupt Onset mid June 2019 assoc dry cough/ sneeze lots vick's spray for nose/ not disturbing sleep Doe = MMRC3 = can't walk 100 yards even at a slow pace at a flat grade s stopping due to sob  Worse since stopped gerd rx "I don't think I need it"   SABA use: not much better p hfa which likes more than the neb though note Ti very short (see copd a/p)  rec Restart protonix 40 mg Take 30-60 min before first meal of the day and continue pepcid 20 mg at bedtime Prednisone 10 mg take  4 each am x 2 days,   2 each am x 2 days,  1 each am x 2 days and stop      10/29/2019  f/u ov/Emma Hudson re:  GOLD II/ 02 hs 2lpm symb/spiriva  [employer] second shot in April 2021 Emma Hudson cough off gerd rx  Chief Complaint  Patient presents with  . Follow-up  Dyspnea:  Can't do buggy at [employer] stopping  Cough: dry daytime with occ  choking sensation off gerd rx/ worse on DPI   Sleeping: bed is flat on side one pillow SABA use: once a day / never  02: 2lpm hs  rec Prednisone 10 mg take  4 each am x 2 days,   2 each am x 2 days,  1 each am x 2 days and stop  Pantoprazole (protonix) 40 mg   Take  30-60 min before first meal of the day  and Pepcid (famotidine)  20 mg one @  bedtime until return to office - this is the best way to tell whether stomach acid is contributing to your problem.   Make sure you check your oxygen saturations at highest level  Plan A = Automatic = Always=    Breztri Take 2 puffs first thing in am and then another 2 puffs about 12  hours later.  stop symbicort and spiriva  Work on inhaler technique:   Plan B = Backup (to supplement plan A, not to replace it) Only use your albuterol inhaler as a rescue medication Plan C = Crisis (instead of Plan B but only if Plan B stops working) - only use your albuterol nebulizer if you first try Plan B      01/28/2020  f/u ov/[place] office/Phila Shoaf re: GOLD II/ 02 hs only on breztri Chief Complaint  Patient presents with  . Follow-up    congestion   Dyspnea:  Much more active but husband does vacuuming and mopping  Cough: minimal mucoid  In am  Sleeping: fine flat bed/ one pillow SABA use: none  02: hs 2lpm only    No obvious day to day or daytime variability or assoc excess/ purulent sputum or mucus plugs or hemoptysis or cp or chest tightness, subjective wheeze or overt sinus or hb symptoms.   Sleeping as above without nocturnal  exacerbation  of respiratory  c/o's or need for noct saba. Also denies any obvious fluctuation of symptoms with weather or environmental changes or other aggravating or alleviating factors except as outlined above   No unusual exposure hx or h/o childhood pna/ asthma or knowledge of premature birth.  Current Allergies, Complete Past Medical History, Past Surgical History, Family History, and Social History were reviewed in [facility] electronic medical record.  ROS  The following are not active complaints unless bolded Hoarseness, sore throat, dysphagia, dental problems, itching, sneezing,  nasal congestion or discharge of excess mucus or purulent secretions, ear ache,   fever, chills, sweats, unintended wt loss or wt gain, classically pleuritic or exertional cp,  orthopnea pnd or arm/hand swelling  or leg swelling, presyncope, palpitations, abdominal pain, anorexia, nausea, vomiting, diarrhea  or change in bowel habits or change in bladder habits, change in stools or change in urine, dysuria, hematuria,  rash, arthralgias, visual complaints,  headache, numbness, weakness or ataxia or problems with walking or coordination,  change in mood or  memory.        Current Meds  Medication Sig  . albuterol (PROAIR HFA) 108 (90 Base) MCG/ACT inhaler 2 puffs every 4 hours as needed only  if your can't catch your breath  . albuterol (PROVENTIL) (2.5 MG/3ML) 0.083% nebulizer solution Take 2.5 mg by nebulization every 6 (six) hours as needed for wheezing or shortness of breath.  Marland Kitchen. amLODipine (NORVASC) 5 MG tablet Take 5 mg by mouth daily.  . Budeson-Glycopyrrol-Formoterol (BREZTRI AEROSPHERE) 160-9-4.8 MCG/ACT AERO Inhale 2 puffs into the lungs 2 (two) times daily.  . famotidine (PEPCID) 20 MG tablet One after supper  . irbesartan-hydrochlorothiazide (AVALIDE) 150-12.5 MG tablet Take 1 tablet by mouth daily.   . Lactobacillus-Inulin (CULTURELLE DIGESTIVE DAILY) CAPS Take 1 capsule by mouth. OTC  . nystatin (MYCOSTATIN) 100000 UNIT/ML suspension 1 tsp 4 x daily  . OXYGEN 2lpm with sleep and exertion [facility] [place], [place]  . pantoprazole (PROTONIX) 40 MG tablet Take 1 tablet (40 mg total) by mouth daily. Take 30-60 min before first meal of the day  .  potassium chloride (K-DUR,KLOR-CON) 10 MEQ tablet Take 10 mEq by mouth daily.  . Vitamin D, Ergocalciferol, (DRISDOL) 50000 units CAPS capsule Take 50,000 Units by mouth every 7 (seven) days.                        Objective:   Physical Exam  slt hoarse wf nad   01/28/2020       124  10/29/2019       126  12/25/2017       134  11/23/2017       139   05/11/17 138 lb 12.8 oz (63 kg)  02/06/17 137 lb 3.2 oz (62.2 kg)  12/26/16 136 lb (61.7 kg)       Vital signs reviewed  01/28/2020  - Note at rest 02 sats  96% on RA    HEENT : pt wearing mask not removed for exam due to covid - 19 concerns.   NECK :  without JVD/Nodes/TM/ nl carotid upstrokes bilaterally   LUNGS: no acc muscle use,  Min barrel  contour chest wall with bilateral  slightly decreased bs s audible wheeze and   without cough on insp or exp maneuvers and min  Hyperresonant  to  percussion bilaterally     CV:  RRR  no s3 or murmur or increase in P2, and no edema   ABD:  soft and nontender with pos end  insp Hoover's  in the supine position. No bruits or organomegaly appreciated, bowel sounds nl  MS:   Nl gait/  ext warm without deformities, calf tenderness, cyanosis or clubbing No obvious joint restrictions   SKIN: warm and dry without lesions    NEURO:  alert, approp, nl sensorium with  no motor or cerebellar deficits apparent.                       Assessment & Plan:

## 2020-01-28 NOTE — Assessment & Plan Note (Signed)
Quit 2009 Spirometry 04/11/2016  FEV1 0.75 (30%)  Ratio 43 p am spiriva dpi/ sym 160  With classic curvature  -04/11/2016  changed to spiriva respimat   - 11/23/2017  After extensive coaching inhaler device  effectiveness =    75% from a baseline of < 50% (short Ti)  - Allergy profile 11/23/17  >  Eos 0.1 /  IgE  23 RAST neg  - improved as of 12/25/2017 on trelegy > pulmonary f/u prn  - [facility] AT  Screen   12/25/17   MM / 163  - 10/29/2019  After extensive coaching inhaler device,  effectiveness =    75% (short Ti) > start breztri - 01/28/2020  After extensive coaching inhaler device,  effectiveness =    90%   Group D in terms of symptom/risk and laba/lama/ICS  therefore appropriate rx at this point >>>  breztri 2bid and prn saba  Re saba I spent extra time with pt today reviewing appropriate use of albuterol for prn use on exertion with the following points: 1) saba is for relief of sob that does not improve by walking a slower pace or resting but rather if the pt does not improve after trying this first. 2) If the pt is convinced, as many are, that saba helps recover from activity faster then it's easy to tell if this is the case by re-challenging : ie stop, take the inhaler, then p 5 minutes try the exact same activity (intensity of workload) that just caused the symptoms and see if they are substantially diminished or not after saba 3) if there is an activity that reproducibly causes the symptoms, try the saba 15 min before the activity on alternate days   If in fact the saba really does help, then fine to continue to use it prn but advised may need to look closer at the maintenance regimen being used to achieve better control of airways disease with exertion.

## 2020-01-28 NOTE — Patient Instructions (Addendum)
Plan A = Automatic = Always=    Breztri Take 2 puffs first thing in am and then another 2 puffs about 12 hours later.   Plan B = Backup (to supplement plan A, not to replace it) Only use your albuterol inhaler as a rescue medication to be used if you can't catch your breath by resting or doing a relaxed purse lip breathing pattern.  - The less you use it, the better it will work when you need it. - Ok to use the inhaler up to 2 puffs  every 4 hours if you must but call for appointment if use goes up over your usual need - Don't leave home without it !!  (think of it like the spare tire for your car)   Plan C = Crisis (instead of Plan B but only if Plan B stops working) - only use your albuterol nebulizer if you first try Plan B and it fails to help > ok to use the nebulizer up to every 4 hours but if start needing it regularly call for immediate appointment  Try albuterol 15 min before an activity that you know would make you short of breath and see if it makes any difference and if makes none then don't take it after activity unless you can't catch your breath.      Plan D = Doctor - call me if B and C not adequate  Plan E = ER - go to ER or call 911 if all else fails      Please schedule a follow up visit in 12  months but call sooner if needed

## 2020-01-29 ENCOUNTER — Encounter: Payer: Self-pay | Admitting: Internal Medicine

## 2020-07-29 ENCOUNTER — Telehealth: Payer: Self-pay | Admitting: Internal Medicine

## 2020-07-29 NOTE — Telephone Encounter (Signed)
Spoke with Emma [facility]  I advised that we did get the form from them for MW to sign  I have placed in his lookat on pod A  Advised her that he will sign when returns to [facility] 07/26/20

## 2020-08-02 NOTE — Telephone Encounter (Signed)
Done

## 2020-08-02 NOTE — Telephone Encounter (Signed)
Given to Johny Drilling  Will close encounter

## 2020-10-11 ENCOUNTER — Ambulatory Visit: Payer: Medicare Other | Admitting: Internal Medicine

## 2020-11-12 ENCOUNTER — Other Ambulatory Visit: Payer: Self-pay

## 2020-11-12 ENCOUNTER — Ambulatory Visit (INDEPENDENT_AMBULATORY_CARE_PROVIDER_SITE_OTHER): Payer: Medicare Other

## 2020-11-12 ENCOUNTER — Ambulatory Visit (INDEPENDENT_AMBULATORY_CARE_PROVIDER_SITE_OTHER): Payer: Medicare Other | Admitting: Internal Medicine

## 2020-11-12 ENCOUNTER — Encounter: Payer: Self-pay | Admitting: Internal Medicine

## 2020-11-12 DIAGNOSIS — J9611 Chronic respiratory failure with hypoxia: Secondary | ICD-10-CM | POA: Diagnosis not present

## 2020-11-12 DIAGNOSIS — J449 Chronic obstructive pulmonary disease, unspecified: Secondary | ICD-10-CM

## 2020-11-12 MED ORDER — PANTOPRAZOLE SODIUM 40 MG PO TBEC: 40.0000 mg | DELAYED_RELEASE_TABLET | Freq: Every day | ORAL | 2 refills | Status: DC

## 2020-11-12 MED ORDER — FAMOTIDINE 20 MG PO TABS: ORAL_TABLET | ORAL | 11 refills | Status: AC

## 2020-11-12 MED ORDER — BREZTRI AEROSPHERE 160-9-4.8 MCG/ACT IN AERO: 2.0000 | INHALATION_SPRAY | Freq: Two times a day (BID) | RESPIRATORY_TRACT | 11 refills | Status: DC

## 2020-11-12 NOTE — Patient Instructions (Signed)
Make sure you check your oxygen saturation at your highest level of activity  to be sure it stays over 90% and adjust  02 flow upward to maintain this level if needed but remember to turn it back to previous settings when you stop (to conserve your supply).   Please remember to go to the  x-ray department  for your tests - we will call you with the results when they are available       Please schedule a follow up visit in 3 months but call sooner if needed  

## 2020-11-12 NOTE — Progress Notes (Signed)
Subjective:    Patient ID: Emma [facility], female    DOB: 02-24-1951,    MRN: [id number]   Brief patient profile:  52   yowf  MM/quit smoking 2009 with symbicort/spiriva dpi  dep since 2007 with dx of copd and no change in ex tol on 160 2bid and 2lpm hs then admitted to [facility] with ? CAP but not back to baseline so self referred to pulmonary clinic 04/11/2016    History of Present Illness  04/11/2016 1st [facility] visit/ Emma Hudson   Chief Complaint  Patient presents with   Pulmonary Consult    Self referral. Pt states she was dxed with COPD in 2010.  Pt states she had PNA approx 5 wks ago, and her breathing is not quite back to her normal baseline. She had already been on 2lpm o2 at bedtime since April 2017, but when she was discharged from hospital recently they advised her to use 8lpm 24/7. She is only using with sleep though, and feels daytime o2 is not needed.     90% recovered from admit still weak  Doe = MMRC2 = can't walk a nl pace on a flat grade s sob but does fine slow and flat eg  shopping ok with buggy  Has proventil rarely, not using at  all the neb  rec Plan A = Automatic = symbicort 160 Take 2 puffs first thing in am and then another 2 puffs about 12 hours later                                     spiriva respimat 2 each am  Plan B = Backup Only use your albuterol as a rescue medication  Plan C = Crisis - only use your albuterol nebulizer if you first try Plan B      08/21/2016  f/u ov/Emma [facility] re: copd III on symb/spiriva / declined rehab "too far to travel"  Chief Complaint  Patient presents with   Follow-up    Pt c/o worsening SOB after having influenza 5 wks ago. She states she is having a "dry, choking cough".  She states she is using albuterol inhaler 2 x per wk and neb with albuterol at least once per day on average.   since viral uri doe across the room and new c/o  choking on food points to neck, suprasternal notch where still has "tickle"  Sleeping  fine on 2lpm hs but no amb 02  rec Prednisone 10 mg take  4 each am x 2 days,   2 each am x 2 days,  1 each am x 2 days and stop  Try prilosec otc 20mg   Take 30-60 min before first meal of the day and Pepcid ac (famotidine) 20 mg one @  bedtime until cough/choking are completely gone for at least a week without the need for cough suppression> did not do  Please see patient coordinator before you leave today  to schedule ambulatory 02 titration to see if eligible for POC> did not obtain portable 02  GERD diet        12/26/2016  f/u ov/Emma [facility] re:  COPD GOLD III symb/spiriva / not taking ppi/h2hs as instructed  Chief Complaint  Patient presents with   Follow-up    Pt states breathing has been worse and she has had sore throat recently.  She also c/o non prod cough. She is using her albuterol  inhaler 3 x per wk and neb with albuterol 2 x per wk on average.   sleeps fine on 2lpm s am cough / then cough starts up dry feels choked / globus senstiona/ assoc With watery rhinitis Prednisone always helps / not using albuterol daily / never at hs  Doe=  MMRC3 = can't walk 100 yards even at a slow pace at a flat grade s stopping due to sob  s 02  rec Pantoprazole (protonix) 40 mg   Take  30-60 min before first meal of the day and Pepcid (famotidine)  20 mg one @ bedtime until return to [facility] - this is the best way to tell whether stomach acid is contributing to your problem.   Prednisone 10 mg take  4 each am x 2 days,   2 each am x 2 days,  1 each am x 2 days and stop  Hold norvasc when your blood pressure is running under 110 on the top number  Please see patient coordinator before you leave today  to sort out what is it [facility] needs for portable 02    02/06/17 NP ov: Continue on Symbicort and Spiriva. Get flu shot as discussed Use  oxygen 2 L with activity and at bedtime Order to DME for portable oxygen system Follow med calendar closely and bring to each visit         11/23/2017  f/u ov/Emma [facility]  re: copd GOLD III/ 02 2lpm at hs / most of the time during the day when active 2lpm Chief Complaint  Patient presents with   Follow-up    Breathing has been worse over the past month. She states it's due to the hot weather. She gets SOB "with any kind of movement at all"- out of breath walking from lobby to exam room today. She has some chest congestion and non prod cough.  She is using her albuterol inhaler 2 x per wk on and neb once per wk on average.   Dyspnea:  Abrupt Onset mid June 2019 assoc dry cough/ sneeze lots vick's spray for nose/ not disturbing sleep Doe = MMRC3 = can't walk 100 yards even at a slow pace at a flat grade s stopping due to sob  Worse since stopped gerd rx "I don't think I need it"   SABA use: not much better p hfa which likes more than the neb though note Ti very short (see copd a/p)  rec Restart protonix 40 mg Take 30-60 min before first meal of the day and continue pepcid 20 mg at bedtime Prednisone 10 mg take  4 each am x 2 days,   2 each am x 2 days,  1 each am x 2 days and stop      10/29/2019  f/u ov/Emma [facility] re:  GOLD II/ 02 hs 2lpm symb/spiriva  [employer] second shot in April 2021 [name] cough off gerd rx  Chief Complaint  Patient presents with   Follow-up  Dyspnea:  Can't do buggy at [employer] s stopping  Cough: dry daytime with occ  choking sensation off gerd rx/ worse on DPI   Sleeping: bed is flat on side one pillow SABA use: once a day / never  02: 2lpm hs  rec Prednisone 10 mg take  4 each am x 2 days,   2 each am x 2 days,  1 each am x 2 days and stop  Pantoprazole (protonix) 40 mg   Take  30-60 min before first meal of the day  and Pepcid (famotidine)  20 mg one @  bedtime until return to [facility] - this is the best way to tell whether stomach acid is contributing to your problem.   Make sure you check your oxygen saturations at highest level  Plan A = Automatic = Always=    Breztri Take 2 puffs first thing in am and then another 2 puffs about 12 hours later.   stop symbicort and spiriva  Work on inhaler technique:   Plan B = Backup (to supplement plan A, not to replace it) Only use your albuterol inhaler as a rescue medication Plan C = Crisis (instead of Plan B but only if Plan B stops working) - only use your albuterol nebulizer if you first try Plan B      01/28/2020  f/u ov/Emma [facility]/Emma [facility] re: GOLD II/ 02 hs only on breztri Chief Complaint  Patient presents with   Follow-up    congestion   Dyspnea:  Much more active but husband does vacuuming and mopping  Cough: minimal mucoid  In am  Sleeping: fine flat bed/ one pillow SABA use: none  02: hs 2lpm only  Rec Plan A = Automatic = Always=    Breztri Take 2 puffs first thing in am and then another 2 puffs about 12 hours later.  Plan B = Backup (to supplement plan A, not to replace it) Only use your albuterol inhaler as a rescue medication Plan C = Crisis (instead of Plan B but only if Plan B stops working) - only use your albuterol nebulizer if you first try Plan B and it fails to help  Try albuterol 15 min before an activity that you know would make you short of breath and see if it makes any difference and if makes none then don't take it after activity unless you can't catch your breath.   11/12/2020  f/u ov/Emma [facility] re:  GOLD II  02 hs only / maint on breztri p covid omicron No chief complaint on file. Dyspnea:  pushes buggy around [employer]/on 2lpm POC not checking sats  Cough: some p covid/ nothing purulent  Sleeping: flat bed one pillow SABA use: not much at all 02: 2lpm and prn daytime  Covid status:   vax x 2 and omicron early Jun 2022    No obvious day to day or daytime variability or assoc excess/ purulent sputum or mucus plugs or hemoptysis or cp or chest tightness, subjective wheeze or overt sinus or hb symptoms.   Sleeping as above  without nocturnal  or early am exacerbation  of respiratory  c/o's or need for noct saba. Also denies any obvious fluctuation of symptoms  with weather or environmental changes or other aggravating or alleviating factors except as outlined above   No unusual exposure hx or h/o childhood pna/ asthma or knowledge of premature birth.  Current Allergies, Complete Past Medical History, Past Surgical History, Family History, and Social History were reviewed in [facility] electronic medical record.  ROS  The following are not active complaints unless bolded Hoarseness, sore throat, dysphagia, dental problems, itching, sneezing,  nasal congestion or discharge of excess mucus or purulent secretions, ear ache,   fever, chills, sweats, unintended wt loss or wt gain, classically pleuritic or exertional cp,  orthopnea pnd or arm/hand swelling  or leg swelling, presyncope, palpitations, abdominal pain, anorexia, nausea, vomiting, diarrhea  or change in bowel habits or change in bladder habits, change in stools or change in urine, dysuria, hematuria,  rash, arthralgias,  visual complaints, headache, numbness, weakness or ataxia or problems with walking or coordination,  change in mood or  memory.        Current Meds  Medication Sig   albuterol (PROAIR HFA) 108 (90 Base) MCG/ACT inhaler 2 puffs every 4 hours as needed only  if your can't catch your breath   albuterol (PROVENTIL) (2.5 MG/3ML) 0.083% nebulizer solution Take 2.5 mg by nebulization every 6 (six) hours as needed for wheezing or shortness of breath.   amLODipine (NORVASC) 5 MG tablet Take 5 mg by mouth daily.   irbesartan-hydrochlorothiazide (AVALIDE) 150-12.5 MG tablet Take 1 tablet by mouth daily.    Lactobacillus-Inulin (CULTURELLE DIGESTIVE DAILY) CAPS Take 1 capsule by mouth. OTC   nystatin (MYCOSTATIN) 100000 UNIT/ML suspension 1 tsp 4 x daily   OXYGEN 2lpm with sleep and exertion [facility] [place], [place]   potassium chloride (K-DUR,KLOR-CON) 10 MEQ tablet Take 10 mEq by mouth daily.   Rosuvastatin Calcium (CRESTOR PO) Take 1 tablet by mouth daily. ? strength   Vitamin D,  Ergocalciferol, (DRISDOL) 50000 units CAPS capsule Take 50,000 Units by mouth every 7 (seven) days.   [ Budeson-Glycopyrrol-Formoterol (BREZTRI AEROSPHERE) 160-9-4.8 MCG/ACT AERO Inhale 2 puffs into the lungs 2 (two) times daily.                               Objective:   Physical Exam     11/12/2020         120 01/28/2020       124  10/29/2019       126  12/25/2017       134  11/23/2017       139   05/11/17 138 lb 12.8 oz (63 kg)  02/06/17 137 lb 3.2 oz (62.2 kg)  12/26/16 136 lb (61.7 kg)       Vital signs reviewed  11/12/2020  - Note at rest 02 sats  97% on RA   General appearance:    ambulatory wf nad     HEENT : pt wearing mask not removed for exam due to covid - 19 concerns.   NECK :  without JVD/Nodes/TM/ nl carotid upstrokes bilaterally   LUNGS: no acc muscle use,  Min barrel  contour chest wall with bilateral  slightly decreased bs s audible wheeze and  without cough on insp or exp maneuvers and min  Hyperresonant  to  percussion bilaterally     CV:  RRR  no s3 or murmur or increase in P2, and no edema   ABD:  soft and nontender with pos end  insp Hoover's  in the supine position. No bruits or organomegaly appreciated, bowel sounds nl  MS:   Nl gait/  ext warm without deformities, calf tenderness, cyanosis or clubbing No obvious joint restrictions   SKIN: warm and dry without lesions    NEURO:  alert, approp, nl sensorium with  no motor or cerebellar deficits apparent.        CXR PA and Lateral:   11/12/2020 :    I personally reviewed images / impression as follows:   Copd no acute changes                         Assessment & Plan:   Outpatient Encounter Medications as of 11/12/2020  Medication Sig   albuterol (PROAIR HFA) 108 (90 Base) MCG/ACT inhaler 2 puffs every 4 hours as needed  only  if your can't catch your breath   albuterol (PROVENTIL) (2.5 MG/3ML) 0.083% nebulizer solution Take 2.5 mg by nebulization every 6 (six) hours as needed for  wheezing or shortness of breath.   amLODipine (NORVASC) 5 MG tablet Take 5 mg by mouth daily.   irbesartan-hydrochlorothiazide (AVALIDE) 150-12.5 MG tablet Take 1 tablet by mouth daily.    Lactobacillus-Inulin (CULTURELLE DIGESTIVE DAILY) CAPS Take 1 capsule by mouth. OTC   nystatin (MYCOSTATIN) 100000 UNIT/ML suspension 1 tsp 4 x daily   OXYGEN 2lpm with sleep and exertion [facility] [place], [place]   potassium chloride (K-DUR,KLOR-CON) 10 MEQ tablet Take 10 mEq by mouth daily.   Rosuvastatin Calcium (CRESTOR PO) Take 1 tablet by mouth daily. ? strength   Vitamin D, Ergocalciferol, (DRISDOL) 50000 units CAPS capsule Take 50,000 Units by mouth every 7 (seven) days.   [DISCONTINUED] Budeson-Glycopyrrol-Formoterol (BREZTRI AEROSPHERE) 160-9-4.8 MCG/ACT AERO Inhale 2 puffs into the lungs 2 (two) times daily.   [DISCONTINUED] famotidine (PEPCID) 20 MG tablet One after supper   [DISCONTINUED] pantoprazole (PROTONIX) 40 MG tablet Take 1 tablet (40 mg total) by mouth daily. Take 30-60 min before first meal of the day   Budeson-Glycopyrrol-Formoterol (BREZTRI AEROSPHERE) 160-9-4.8 MCG/ACT AERO Inhale 2 puffs into the lungs 2 (two) times daily.   famotidine (PEPCID) 20 MG tablet One after supper   pantoprazole (PROTONIX) 40 MG tablet Take 1 tablet (40 mg total) by mouth daily. Take 30-60 min before first meal of the day   No facility-administered encounter medications on file as of 11/12/2020.

## 2020-11-13 ENCOUNTER — Encounter: Payer: Self-pay | Admitting: Internal Medicine

## 2020-11-13 NOTE — Assessment & Plan Note (Signed)
Quit 2009 Spirometry 04/11/2016  FEV1 0.75 (30%)  Ratio 43 p am spiriva dpi/ sym 160  With classic curvature  -04/11/2016  changed to spiriva respimat   - 11/23/2017  After extensive coaching inhaler device  effectiveness =    75% from a baseline of < 50% (short Ti)  - Allergy profile 11/23/17  >  Eos 0.1 /  IgE  23 RAST neg  - improved as of 12/25/2017 on trelegy > pulmonary f/u prn  - [facility] AT  Screen   12/25/17   MM / 163  - 10/29/2019  After extensive coaching inhaler device,  effectiveness =    75% (short Ti) > start breztri - 01/28/2020  After extensive coaching inhaler device,  effectiveness =    90%   Group D in terms of symptom/risk and laba/lama/ICS  therefore appropriate rx at this point >>>  Continue breztri and prn saba  Re saba: I spent extra time with pt today reviewing appropriate use of albuterol for prn use on exertion with the following points: 1) saba is for relief of sob that does not improve by walking a slower pace or resting but rather if the pt does not improve after trying this first. 2) If the pt is convinced, as many are, that saba helps recover from activity faster then it's easy to tell if this is the case by re-challenging : ie stop, take the inhaler, then p 5 minutes try the exact same activity (intensity of workload) that just caused the symptoms and see if they are substantially diminished or not after saba 3) if there is an activity that reproducibly causes the symptoms, try the saba 15 min before the activity on alternate days   If in fact the saba really does help, then fine to continue to use it prn but advised may need to look closer at the maintenance regimen being used to achieve better control of airways disease with exertion.

## 2020-11-13 NOTE — Assessment & Plan Note (Signed)
02 rx since 2007  - as of initial pulmonary eval 04/11/2016 = 2lpm hs only - 04/11/2016   Walked RA  2 laps @ 185 ft each stopped due to  Sob but no desat at nl  pace  -  05/11/2017 Patient Saturations on Room Air at Rest = 94% > while Ambulating = 85% And on  2 Liters of oxygen while Ambulating = 95%  As of 11/12/2020 >    2lpm  Hs only and prn daytime  with goal of sat s> 90% at all times  Advised: Make sure you check your oxygen saturation  at your highest level of activity  to be sure it stays over 90% and adjust  02 flow upward to maintain this level if needed but remember to turn it back to previous settings when you stop (to conserve your supply).          Each maintenance medication was reviewed in detail including emphasizing most importantly the difference between maintenance and prns and under what circumstances the prns are to be triggered using an action plan format where appropriate.  Total time for H and P, chart review, counseling, reviewing 02/hfa device(s) and generating customized AVS unique to this office visit / same day charting =  34 min

## 2020-11-15 NOTE — Progress Notes (Signed)
Tried calling the pt, line just rings with no answer and no VM, Will call back.

## 2020-11-19 ENCOUNTER — Telehealth: Payer: Self-pay | Admitting: Internal Medicine

## 2020-11-19 NOTE — Telephone Encounter (Signed)
Spoke with pt and notified of her CXR results per Dr. Sherene Sires. Pt verbalized understanding and denied any questions.

## 2020-11-19 NOTE — Telephone Encounter (Signed)
Emma Cowden, MD  11/14/2020  7:27 AM EDT      Call pt:  Reviewed cxr and no acute change so no change in recommendations madeat ov   Called mobile provided, was told it was the wrong number. Called the home number, no one answered. Will call back later.

## 2020-11-19 NOTE — Telephone Encounter (Signed)
Pt is returning phone call in regards to x-ray results. Pls regard; [phone].

## 2020-12-08 ENCOUNTER — Encounter: Payer: Self-pay | Admitting: Internal Medicine

## 2021-02-15 ENCOUNTER — Ambulatory Visit: Payer: Medicare Other | Admitting: Internal Medicine

## 2021-02-21 ENCOUNTER — Ambulatory Visit (INDEPENDENT_AMBULATORY_CARE_PROVIDER_SITE_OTHER): Payer: Medicare Other | Admitting: Internal Medicine

## 2021-02-21 ENCOUNTER — Encounter: Payer: Self-pay | Admitting: Internal Medicine

## 2021-02-21 ENCOUNTER — Other Ambulatory Visit: Payer: Self-pay

## 2021-02-21 VITALS — BP 128/86 | HR 77 | Temp 97.9°F | Ht 64.0 in | Wt 122.2 lb

## 2021-02-21 DIAGNOSIS — J441 Chronic obstructive pulmonary disease with (acute) exacerbation: Secondary | ICD-10-CM | POA: Diagnosis not present

## 2021-02-21 DIAGNOSIS — J9611 Chronic respiratory failure with hypoxia: Secondary | ICD-10-CM | POA: Diagnosis not present

## 2021-02-21 DIAGNOSIS — J449 Chronic obstructive pulmonary disease, unspecified: Secondary | ICD-10-CM

## 2021-02-21 DIAGNOSIS — R0609 Other forms of dyspnea: Secondary | ICD-10-CM

## 2021-02-21 NOTE — Progress Notes (Signed)
Subjective:    Patient ID: Emma Hudson, female    DOB: [birth year],    MRN: [id number]   Brief patient profile:  29   yowf  MM/quit smoking 2009   lama/laba/ics dep since 2007 with dx of copd and no change in ex tol on 160 2bid and 2lpm hs then admitted to [facility] with ? CAP but not back to baseline so self referred to pulmonary clinic 04/11/2016    History of Present Illness  04/11/2016 1st [facility] office visit/ Webster Patrone   Chief Complaint  Patient presents with   Pulmonary Consult    Self referral. Pt states she was dxed with COPD in 2010.  Pt states she had PNA approx 5 wks ago, and her breathing is not quite back to her normal baseline. She had already been on 2lpm o2 at bedtime since April 2017, but when she was discharged from hospital recently they advised her to use 8lpm 24/7. She is only using with sleep though, and feels daytime o2 is not needed.     90% recovered from admit still weak  Doe = MMRC2 = can't walk a nl pace on a flat grade s sob but does fine slow and flat eg  shopping ok with buggy  Has proventil rarely, not using at  all the neb  rec Plan A = Automatic = symbicort 160 Take 2 puffs first thing in am and then another 2 puffs about 12 hours later                                     spiriva respimat 2 each am  Plan B = Backup Only use your albuterol as a rescue medication  Plan C = Crisis - only use your albuterol nebulizer if you first try Plan B    12/26/2016  f/u ov/Elya Tarquinio re:  COPD GOLD 3symb/spiriva / not taking ppi/h2hs as instructed  Chief Complaint  Patient presents with   Follow-up    Pt states breathing has been worse and she has had sore throat recently.  She also c/o non prod cough. She is using her albuterol inhaler 3 x per wk and neb with albuterol 2 x per wk on average.   sleeps fine on 2lpm s am cough / then cough starts up dry feels choked / globus senstiona/ assoc With watery rhinitis Prednisone always helps / not using albuterol daily /  never at hs  Doe=  MMRC3 = can't walk 100 yards even at a slow pace at a flat grade s stopping due to sob  s 02  rec Pantoprazole (protonix) 40 mg   Take  30-60 min before first meal of the day and Pepcid (famotidine)  20 mg one @ bedtime until return to office - this is the best way to tell whether stomach acid is contributing to your problem.   Prednisone 10 mg take  4 each am x 2 days,   2 each am x 2 days,  1 each am x 2 days and stop  Hold norvasc when your blood pressure is running under 110 on the top number  Please see patient coordinator before you leave today  to sort out what is it [facility] needs for portable 02    01/28/2020  f/u ov/[place] office/Janijah Symons re: GOLD3/ 02 hs only on breztri Chief Complaint  Patient presents with   Follow-up    congestion  Dyspnea:  Much more active but husband does vacuuming and mopping  Cough: minimal mucoid  In am  Sleeping: fine flat bed/ one pillow SABA use: none  02: hs 2lpm only  Rec Plan A = Automatic = Always=    Breztri Take 2 puffs first thing in am and then another 2 puffs about 12 hours later.  Plan B = Backup (to supplement plan A, not to replace it) Only use your albuterol inhaler as a rescue medication Plan C = Crisis (instead of Plan B but only if Plan B stops working) - only use your albuterol nebulizer if you first try Plan B and it fails to help  Try albuterol 15 min before an activity that you know would make you short of breath and see if it makes any difference and if makes none then don't take it after activity unless you can't catch your breath.   11/12/2020  f/u ov/Duey Liller re:  GOLD 3 02 hs only / maint on breztri p covid omicron Dyspnea:  pushes buggy around [employer]/on 2lpm POC not checking sats  Cough: some p covid/ nothing purulent  Sleeping: flat bed one pillow SABA use: not much at all 02: 2lpm and prn daytime  Covid status:   vax x 2 and omicron early Jun 2022  Rec 02 titrate to highest activity     02/21/2021   f/u ov/Ifeoluwa Beller re: GOLD 3 copd/ 02 2lpm hs  only maint on breztri  Chief Complaint  Patient presents with   Follow-up    3 mo follow up: dry cough and wheezing   Dyspnea:  grocery shopping/ HC parking  Cough: sense of pnds it's the allergy  Sleeping: flat bed one pillow SABA use: rarely  02: 2 lpm hs and prn but not checking sats at peak ex as reck Covid status:  vax x 2 / omicron in June 2022    No obvious day to day or daytime variability or assoc excess/ purulent sputum or mucus plugs or hemoptysis or cp or chest tightness, subjective wheeze or overt sinus or hb symptoms.   Sleeping as above  without nocturnal  or early am exacerbation  of respiratory  c/o's or need for noct saba. Also denies any obvious fluctuation of symptoms with weather or environmental changes or other aggravating or alleviating factors except as outlined above   No unusual exposure hx or h/o childhood pna/ asthma or knowledge of premature birth.  Current Allergies, Complete Past Medical History, Past Surgical History, Family History, and Social History were reviewed in [employer] record.  ROS  The following are not active complaints unless bolded Hoarseness, sore throat, dysphagia, dental problems, itching, sneezing,  nasal congestion or discharge of excess mucus or purulent secretions, ear ache,   fever, chills, sweats, unintended wt loss or wt gain, classically pleuritic or exertional cp,  orthopnea pnd or arm/hand swelling  or leg swelling, presyncope, palpitations, abdominal pain, anorexia, nausea, vomiting, diarrhea  or change in bowel habits or change in bladder habits, change in stools or change in urine, dysuria, hematuria,  rash, arthralgias, visual complaints, headache, numbness, weakness or ataxia or problems with walking or coordination,  change in mood or  memory.        Current Meds  Medication Sig   albuterol (PROAIR HFA) 108 (90 Base) MCG/ACT inhaler 2 puffs every 4 hours as  needed only  if your can't catch your breath   albuterol (PROVENTIL) (2.5 MG/3ML) 0.083% nebulizer solution Take 2.5  mg by nebulization every 6 (six) hours as needed for wheezing or shortness of breath.   amLODipine (NORVASC) 5 MG tablet Take 5 mg by mouth daily.   Budeson-Glycopyrrol-Formoterol (BREZTRI AEROSPHERE) 160-9-4.8 MCG/ACT AERO Inhale 2 puffs into the lungs 2 (two) times daily.   irbesartan-hydrochlorothiazide (AVALIDE) 150-12.5 MG tablet Take 1 tablet by mouth daily.    Lactobacillus-Inulin (CULTURELLE DIGESTIVE DAILY) CAPS Take 1 capsule by mouth. OTC   nystatin (MYCOSTATIN) 100000 UNIT/ML suspension 1 tsp 4 x daily   OXYGEN 2lpm with sleep and exertion [facility] [place]   pantoprazole (PROTONIX) 40 MG tablet Take 1 tablet (40 mg total) by mouth daily. Take 30-60 min before first meal of the day   potassium chloride (K-DUR,KLOR-CON) 10 MEQ tablet Take 10 mEq by mouth daily.   Rosuvastatin Calcium (CRESTOR PO) Take 1 tablet by mouth daily. ? strength   Vitamin D, Ergocalciferol, (DRISDOL) 50000 units CAPS capsule Take 50,000 Units by mouth every 7 (seven) days.            Objective:   Physical Exam    02/21/2021      122  11/12/2020         120 01/28/2020       124  10/29/2019       126  12/25/2017       134  11/23/2017       139   05/11/17 138 lb 12.8 oz (63 kg)  02/06/17 137 lb 3.2 oz (62.2 kg)  12/26/16 136 lb (61.7 kg)       Vital signs reviewed  02/21/2021  - Note at rest 02 sats  96% on RA   General appearance:    pleasant amb wf nad     HEENT : pt wearing mask not removed for exam due to covid - 19 concerns.   NECK :  without JVD/Nodes/TM/ nl carotid upstrokes bilaterally   LUNGS: no acc muscle use,  Min barrel  contour chest wall with bilateral  slightly decreased bs s audible wheeze and  without cough on insp or exp maneuvers and min  Hyperresonant  to  percussion bilaterally     CV:  RRR  no s3 or murmur or increase in P2, and no edema   ABD:   soft and nontender with pos end  insp Hoover's  in the supine position. No bruits or organomegaly appreciated, bowel sounds nl  MS:   Nl gait/  ext warm without deformities, calf tenderness, cyanosis or clubbing No obvious joint restrictions   SKIN: warm and dry without lesions    NEURO:  alert, approp, nl sensorium with  no motor or cerebellar deficits apparent.                       Assessment & Plan:

## 2021-02-21 NOTE — Assessment & Plan Note (Signed)
02 rx since 2007  - as of initial pulmonary eval 04/11/2016 = 2lpm hs only - 04/11/2016   Walked RA  2 laps @ 185 ft each stopped due to  Sob but no desat at nl  pace  -  05/11/2017 Patient Saturations on Room Air at Rest = 94% > while Ambulating = 85% And on  2 Liters of oxygen while Ambulating = 95%  As of 02/21/2021 >    2lpm  Hs only and prn daytime  with goal of sat s> 90% at all times         Each maintenance medication was reviewed in detail including emphasizing most importantly the difference between maintenance and prns and under what circumstances the prns are to be triggered using an action plan format where appropriate.  Total time for H and P, chart review, counseling, reviewing hfa/ 02  device(s) and generating customized AVS unique to this office visit / same day charting = 27 min

## 2021-02-21 NOTE — Assessment & Plan Note (Addendum)
Quit 2009 Spirometry 04/11/2016  FEV1 0.75 (30%)  Ratio 43 p am spiriva dpi/ sym 160  With classic curvature  -04/11/2016  changed to spiriva respimat   - 11/23/2017  After extensive coaching inhaler device  effectiveness =    75% from a baseline of < 50% (short Ti)  - Allergy profile 11/23/17  >  Eos 0.1 /  IgE  23 RAST neg  - improved as of 12/25/2017 on trelegy > pulmonary f/u prn  - [facility] AT  Screen   12/25/17   MM / 163  - 10/29/2019  After extensive coaching inhaler device,  effectiveness =    75% (short Ti) > start breztri - 01/28/2020  After extensive coaching inhaler device,  effectiveness =    90%   Group D in terms of symptom/risk and laba/lama/ICS  therefore appropriate rx at this point >>>  breztri apprrop and  F/u can be yearly, sooner prn with saba as backup   Low-dose CT lung cancer screening is recommended for patients who are 25-40 years of age with a 20+ pack-year history of smoking, and who are currently smoking or quit <=15 years ago. No coughing up blood  No unintentional weight loss of > 15 pounds in the last 6 months >>> pt meets criteria for new 2 years > referred

## 2021-02-21 NOTE — Patient Instructions (Addendum)
I will be referring you to our lung scanning program which is run by our Nurse Practitioner  in [place] by phone - can be done wherever it is most convenient for you    Please schedule a follow up visit in 12 months but call sooner if needed

## 2021-02-22 ENCOUNTER — Other Ambulatory Visit: Payer: Self-pay | Admitting: Internal Medicine

## 2021-04-12 ENCOUNTER — Other Ambulatory Visit: Payer: Self-pay

## 2021-04-12 DIAGNOSIS — Z87891 Personal history of nicotine dependence: Secondary | ICD-10-CM

## 2021-05-23 ENCOUNTER — Other Ambulatory Visit: Payer: Self-pay

## 2021-05-23 ENCOUNTER — Ambulatory Visit (INDEPENDENT_AMBULATORY_CARE_PROVIDER_SITE_OTHER): Payer: Medicare Other | Admitting: Acute Care

## 2021-05-23 ENCOUNTER — Encounter: Payer: Self-pay | Admitting: Acute Care

## 2021-05-23 DIAGNOSIS — Z87891 Personal history of nicotine dependence: Secondary | ICD-10-CM | POA: Diagnosis not present

## 2021-05-23 NOTE — Patient Instructions (Signed)
Thank you for participating in the [facility] Screening [facility]. °It was our pleasure to meet you today. °We will call you with the results of your scan within the next few days. °Your scan will be assigned a Lung RADS category score by the physicians reading the scans.  °This Lung RADS score determines follow up scanning.  °See below for description of categories, and follow up screening recommendations. °We will be in touch to schedule your follow up screening annually or based on recommendations of our providers. °We will fax a copy of your scan results to your Primary Care Physician, or the physician who referred you to the program, to ensure they have the results. °Please call the office if you have any questions or concerns regarding your scanning experience or results.  °Our office number is [phone]. °Please speak with Denise Phelps, RN. She is our Lung Cancer Screening RN. °If she is unavailable when you call, please have the office staff send her a message. She will return your call at her earliest convenience. °Remember, if your scan is normal, we will scan you annually as long as you continue to meet the criteria for the program. (Age 55-77, Current smoker or smoker who has quit within the last 15 years). °If you are a smoker, remember, quitting is the single most powerful action that you can take to decrease your risk of lung cancer and other pulmonary, breathing related problems. °We know quitting is hard, and we are here to help.  °Please let us know if there is anything we can do to help you meet your goal of quitting. °If you are a former smoker, congratulations. We are proud of you! Remain smoke free! °Remember you can refer friends or family members through the number above.  °We will screen them to make sure they meet criteria for the program. °Thank you for helping us take better care of you by participating in Lung Screening. ° °You can receive free nicotine replacement therapy  ( patches, gum or mints) by calling 1-800-QUIT NOW. Please call so we can get you on the path to becoming  a non-smoker. I know it is hard, but you can do this! ° °Lung RADS Categories: ° °Lung RADS 1: no nodules or definitely non-concerning nodules.  °Recommendation is for a repeat annual scan in 12 months. ° °Lung RADS 2:  nodules that are non-concerning in appearance and behavior with a very low likelihood of becoming an active cancer. °Recommendation is for a repeat annual scan in 12 months. ° °Lung RADS 3: nodules that are probably non-concerning , includes nodules with a low likelihood of becoming an active cancer.  Recommendation is for a 6-month repeat screening scan. Often noted after an upper respiratory illness. We will be in touch to make sure you have no questions, and to schedule your 6-month scan. ° °Lung RADS 4 A: nodules with concerning findings, recommendation is most often for a follow up scan in 3 months or additional testing based on our provider's assessment of the scan. We will be in touch to make sure you have no questions and to schedule the recommended 3 month follow up scan. ° °Lung RADS 4 B:  indicates findings that are concerning. We will be in touch with you to schedule additional diagnostic testing based on our provider's  assessment of the scan. ° °Hypnosis for smoking cessation  °[employer] °[phone] ° °Acupuncture for smoking cessation  °[facility] °[phone]  °

## 2021-05-23 NOTE — Progress Notes (Addendum)
Virtual Visit via Telephone Note  I connected with Emma Hudson on 05/23/21 at  2:00 PM EST by telephone and verified that I am speaking with the correct person using two identifiers.  Location: Patient:  At home  Provider:  [address], [place], [place], [address]    I discussed the limitations, risks, security and privacy concerns of performing an evaluation and management service by telephone and the availability of in person appointments. I also discussed with the patient that there may be a patient responsible charge related to this service. The patient expressed understanding and agreed to proceed.     Shared Decision Making Visit Lung Cancer Screening Program [phone])   Eligibility: Age 71 y.o. Pack Years Smoking History Calculation 31 pack year smoking history (# packs/per year x # years smoked) Recent History of coughing up blood  no Unexplained weight loss? no ( >Than 15 pounds within the last 6 months ) Prior History Lung / other cancer no (Diagnosis within the last 5 years already requiring surveillance chest CT Scans). Smoking Status Former Smoker Former Smokers: Years since quit: 14 years  Quit Date: 2009  Visit Components: Discussion included one or more decision making aids. yes Discussion included risk/benefits of screening. yes Discussion included potential follow up diagnostic testing for abnormal scans. yes Discussion included meaning and risk of over diagnosis. yes Discussion included meaning and risk of False Positives. yes Discussion included meaning of total radiation exposure. yes  Counseling Included: Importance of adherence to annual lung cancer LDCT screening. yes Impact of comorbidities on ability to participate in the program. yes Ability and willingness to under diagnostic treatment. yes  Smoking Cessation Counseling: Current Smokers:  Discussed importance of smoking cessation. yes Information about tobacco cessation classes and  interventions provided to patient. yes Patient provided with "ticket" for LDCT Scan. yes Symptomatic Patient. no  Counseling NA Diagnosis Code: Tobacco Use Z72.0 Asymptomatic Patient yes  Counseling (Intermediate counseling: > three minutes counseling) P5916 Former Smokers:  Discussed the importance of maintaining cigarette abstinence. yes Diagnosis Code: Personal History of Nicotine Dependence. B84.665 Information about tobacco cessation classes and interventions provided to patient. Yes Patient provided with "ticket" for LDCT Scan. yes Written Order for Lung Cancer Screening with LDCT placed in Epic. Yes (CT Chest Lung Cancer Screening Low Dose W/O CM) [id number] Z12.2-Screening of respiratory organs Z87.891-Personal history of nicotine dependence  I spent 25 minutes of face to face time/virtual visit time  with Emma Hudson discussing the risks and benefits of lung cancer screening. We took the time to pause the power point at intervals to allow for questions to be asked and answered to ensure understanding. We discussed that she had taken the single most powerful action possible to decrease her risk of developing lung cancer when she quit smoking. I counseled her to remain smoke free, and to contact me if she ever had the desire to smoke again so that I can provide resources and tools to help support the effort to remain smoke free. We discussed the time and location of the scan, and that either  Abigail Miyamoto RN, Karlton Lemon, RN or I  or I will call / send a letter with the results within  24-72 hours of receiving them. She has the office contact information in the event she needs to speak with me,  She  verbalized understanding of all of the above and had no further questions upon leaving the office.     I explained to the patient  that there has been a high incidence of coronary artery disease noted on these exams. I explained that this is a non-gated exam therefore degree or severity cannot  be determined. This patient is on statin therapy. I have asked the patient to follow-up with their PCP regarding any incidental finding of coronary artery disease and management with diet or medication as they feel is clinically indicated. The patient verbalized understanding of the above and had no further questions.     Bevelyn Ngo, NP 05/23/2021

## 2021-05-24 ENCOUNTER — Ambulatory Visit (HOSPITAL_COMMUNITY)
Admission: RE | Admit: 2021-05-24 | Discharge: 2021-05-24 | Disposition: A | Discharge status: Home / Self Care | Payer: Medicare Other | Source: Ambulatory Visit | Attending: Family Medicine | Admitting: Family Medicine

## 2021-05-24 ENCOUNTER — Other Ambulatory Visit: Payer: Self-pay

## 2021-05-24 DIAGNOSIS — Z87891 Personal history of nicotine dependence: Secondary | ICD-10-CM | POA: Insufficient documentation

## 2021-05-25 ENCOUNTER — Other Ambulatory Visit: Payer: Self-pay

## 2021-05-25 DIAGNOSIS — Z87891 Personal history of nicotine dependence: Secondary | ICD-10-CM

## 2021-07-30 ENCOUNTER — Other Ambulatory Visit: Payer: Self-pay | Admitting: Internal Medicine

## 2021-11-25 ENCOUNTER — Telehealth: Payer: Self-pay | Admitting: Internal Medicine

## 2021-11-25 MED ORDER — BREZTRI AEROSPHERE 160-9-4.8 MCG/ACT IN AERO: 2.0000 | INHALATION_SPRAY | Freq: Two times a day (BID) | RESPIRATORY_TRACT | 11 refills | Status: DC

## 2021-11-25 NOTE — Telephone Encounter (Signed)
Called and spoke to patient and verified medication she needed refilled and what pharmacy. Nothing further needed

## 2021-11-25 NOTE — Telephone Encounter (Signed)
Returning a call regarding refill.

## 2021-11-25 NOTE — Telephone Encounter (Signed)
Requesting Breztri refill- states pharmacy sent a request over. [facility] on [address] in [place], [place].

## 2021-11-25 NOTE — Telephone Encounter (Signed)
ATC patient. LVMTCB. 

## 2021-12-13 ENCOUNTER — Encounter: Payer: Self-pay | Admitting: Internal Medicine

## 2021-12-13 ENCOUNTER — Ambulatory Visit (INDEPENDENT_AMBULATORY_CARE_PROVIDER_SITE_OTHER): Payer: Medicare Other | Admitting: Internal Medicine

## 2021-12-13 DIAGNOSIS — J449 Chronic obstructive pulmonary disease, unspecified: Secondary | ICD-10-CM

## 2021-12-13 DIAGNOSIS — J9611 Chronic respiratory failure with hypoxia: Secondary | ICD-10-CM | POA: Diagnosis not present

## 2021-12-13 NOTE — Progress Notes (Unsigned)
Subjective:    Patient ID: Emma Hudson, female    DOB: 05/15/1950,    MRN: [id number]   Brief patient profile:  47  yowf  MM/quit smoking 2009   lama/laba/ics dep since 2007 with dx of copd and no change in ex tol on 160 2bid and 2lpm hs then admitted to [facility] with ? CAP but not back to baseline so self referred to pulmonary clinic 04/11/2016    History of Present Illness  04/11/2016 1st [facility] office visit/ Emma Hudson   Chief Complaint  Patient presents with   Pulmonary Consult    Self referral. Pt states she was dxed with COPD in 2010.  Pt states she had PNA approx 5 wks ago, and her breathing is not quite back to her normal baseline. She had already been on 2lpm o2 at bedtime since April 2017, but when she was discharged from hospital recently they advised her to use 8lpm 24/7. She is only using with sleep though, and feels daytime o2 is not needed.     90% recovered from admit still weak  Doe = MMRC2 = can't walk a nl pace on a flat grade s sob but does fine slow and flat eg  shopping ok with buggy  Has proventil rarely, not using at  all the neb  rec Plan A = Automatic = symbicort 160 Take 2 puffs first thing in am and then another 2 puffs about 12 hours later                                     spiriva respimat 2 each am  Plan B = Backup Only use your albuterol as a rescue medication  Plan C = Crisis - only use your albuterol nebulizer if you first try Plan B    12/26/2016  f/u ov/Emma Hudson re:  COPD GOLD 3symb/spiriva / not taking ppi/h2hs as instructed  Chief Complaint  Patient presents with   Follow-up    Pt states breathing has been worse and she has had sore throat recently.  She also c/o non prod cough. She is using her albuterol inhaler 3 x per wk and neb with albuterol 2 x per wk on average.   sleeps fine on 2lpm s am cough / then cough starts up dry feels choked / globus senstiona/ assoc With watery rhinitis Prednisone always helps / not using albuterol daily /  never at hs  Doe=  MMRC3 = can't walk 100 yards even at a slow pace at a flat grade s stopping due to sob  s 02  rec Pantoprazole (protonix) 40 mg   Take  30-60 min before first meal of the day and Pepcid (famotidine)  20 mg one @ bedtime until return to office - this is the best way to tell whether stomach acid is contributing to your problem.   Prednisone 10 mg take  4 each am x 2 days,   2 each am x 2 days,  1 each am x 2 days and stop  Hold norvasc when your blood pressure is running under 110 on the top number  Please see patient coordinator before you leave today  to sort out what is it [facility] needs for portable 02    01/28/2020  f/u ov/[place] office/Emma Hudson re: GOLD3/ 02 hs only on breztri Chief Complaint  Patient presents with   Follow-up    congestion   Dyspnea:  Much more active but husband does vacuuming and mopping  Cough: minimal mucoid  In am  Sleeping: fine flat bed/ one pillow SABA use: none  02: hs 2lpm only  Rec Plan A = Automatic = Always=    Breztri Take 2 puffs first thing in am and then another 2 puffs about 12 hours later.  Plan B = Backup (to supplement plan A, not to replace it) Only use your albuterol inhaler as a rescue medication Plan C = Crisis (instead of Plan B but only if Plan B stops working) - only use your albuterol nebulizer if you first try Plan B and it fails to help  Try albuterol 15 min before an activity that you know would make you short of breath and see if it makes any difference and if makes none then don't take it after activity unless you can't catch your breath.   11/12/2020  f/u ov/Emma Hudson re:  GOLD 3 02 hs only / maint on breztri p covid omicron Dyspnea:  pushes buggy around [employer]/on 2lpm POC not checking sats  Cough: some p covid/ nothing purulent  Sleeping: flat bed one pillow SABA use: not much at all 02: 2lpm and prn daytime  Covid status:   vax x 2 and omicron early Jun 2022  Rec 02 titrate to highest activity     02/21/2021   f/u ov/Emma Hudson re: GOLD 3 copd/ 02 2lpm hs  only maint on breztri  Chief Complaint  Patient presents with   Follow-up    3 mo follow up: dry cough and wheezing   Dyspnea:  grocery shopping/ HC parking  Cough: sense of pnds it's the allergy  Sleeping: flat bed one pillow SABA use: rarely  02: 2 lpm hs and prn but not checking sats at peak ex as reck Covid status:  vax x 2 / omicron in June 2022  Rec Lung cancer screening> ct chest 05/24/21  1. Lung-RADS 2, benign appearance or behavior. Continue annual screening with low-dose chest CT without contrast in 12 months. 2. Small to moderate hiatal hernia. 3.  Emphysema (ICD10-J43.9) and Aortic Atherosclerosis (ICD10-170.0)    12/13/2021  f/u ov/Emma Hudson re: GOLD 3 copd    maint on breztri  Chief Complaint  Patient presents with   Follow-up    Pt went to Urgent care x 9 days ago and was diagnosed with Bronchial infection. ZPAK did help and she is finished wit the meds.   Dyspnea:  [employer] but not checking sats as rec  Cough: none now p zpak Sleeping: flat bed/ one pillow  SABA use: not needing now 02: 2lpm and prn shopping      No obvious day to day or daytime variability or assoc excess/ purulent sputum or mucus plugs or hemoptysis or cp or chest tightness, subjective wheeze or overt sinus or hb symptoms.   Sleeping  without nocturnal  or early am exacerbation  of respiratory  c/o's or need for noct saba. Also denies any obvious fluctuation of symptoms with weather or environmental changes or other aggravating or alleviating factors except as outlined above   No unusual exposure hx or h/o childhood pna/ asthma or knowledge of premature birth.  Current Allergies, Complete Past Medical History, Past Surgical History, Family History, and Social History were reviewed in [employer] record.  ROS  The following are not active complaints unless bolded Hoarseness, sore throat, dysphagia, dental problems, itching, sneezing,   nasal congestion or discharge of excess mucus or purulent  secretions, ear ache,   fever, chills, sweats, unintended wt loss or wt gain, classically pleuritic or exertional cp,  orthopnea pnd or arm/hand swelling  or leg swelling, presyncope, palpitations, abdominal pain, anorexia, nausea, vomiting, diarrhea  or change in bowel habits or change in bladder habits, change in stools or change in urine, dysuria, hematuria,  rash, arthralgias, visual complaints, headache, numbness, weakness or ataxia or problems with walking or coordination,  change in mood or  memory.        Current Meds  Medication Sig   albuterol (PROAIR HFA) 108 (90 Base) MCG/ACT inhaler 2 puffs every 4 hours as needed only  if your can't catch your breath   albuterol (PROVENTIL) (2.5 MG/3ML) 0.083% nebulizer solution Take 2.5 mg by nebulization every 6 (six) hours as needed for wheezing or shortness of breath.   amLODipine (NORVASC) 5 MG tablet Take 5 mg by mouth daily.   Budeson-Glycopyrrol-Formoterol (BREZTRI AEROSPHERE) 160-9-4.8 MCG/ACT AERO Inhale 2 puffs into the lungs 2 (two) times daily.   famotidine (PEPCID) 20 MG tablet One after supper   irbesartan-hydrochlorothiazide (AVALIDE) 150-12.5 MG tablet Take 1 tablet by mouth daily.    Lactobacillus-Inulin (CULTURELLE DIGESTIVE DAILY) CAPS Take 1 capsule by mouth. OTC   nystatin (MYCOSTATIN) 100000 UNIT/ML suspension 1 tsp 4 x daily   OXYGEN 2lpm with sleep and exertion [facility] [place]   pantoprazole (PROTONIX) 40 MG tablet TAKE 1 TABLET(40 MG) BY MOUTH DAILY 30 TO 60 MINUTES BEFORE FIRST MEAL OF THE DAY   potassium chloride (K-DUR,KLOR-CON) 10 MEQ tablet Take 10 mEq by mouth daily.   Rosuvastatin Calcium (CRESTOR PO) Take 1 tablet by mouth daily. ? strength   Vitamin D, Ergocalciferol, (DRISDOL) 50000 units CAPS capsule Take 50,000 Units by mouth every 7 (seven) days.                Objective:   Physical Exam   12/13/2021         121  02/21/2021      122   11/12/2020         120 01/28/2020       124  10/29/2019       126  12/25/2017       134  11/23/2017       139   05/11/17 138 lb 12.8 oz (63 kg)  02/06/17 137 lb 3.2 oz (62.2 kg)  12/26/16 136 lb (61.7 kg)       Vital signs reviewed  12/13/2021  - Note at rest 02 sats  97% on RA   General appearance:    amb pleasant wf nad     HEENT : Oropharynx  clear  Nasal turbinates nl    NECK :  without  apparent JVD/ palpable Nodes/TM    LUNGS: no acc muscle use,  Min barrel  contour chest wall with bilateral  slightly decreased bs s audible wheeze and  without cough on insp or exp maneuvers and min  Hyperresonant  to  percussion bilaterally    CV:  RRR  no s3 or murmur or increase in P2, and no edema   ABD:  soft and nontender with pos end  insp Hoover's  in the supine position.  No bruits or organomegaly appreciated   MS:  Nl gait/ ext warm without deformities Or obvious joint restrictions  calf tenderness, cyanosis or clubbing     SKIN: warm and dry without lesions    NEURO:  alert, approp, nl sensorium with  no motor  or cerebellar deficits apparent.          Assessment & Plan:

## 2021-12-13 NOTE — Patient Instructions (Signed)
Keep your follow up appt for October here    No change in medications

## 2021-12-14 ENCOUNTER — Encounter: Payer: Self-pay | Admitting: Internal Medicine

## 2021-12-14 NOTE — Assessment & Plan Note (Signed)
02 rx since 2007  - as of initial pulmonary eval 04/11/2016 = 2lpm hs only - 04/11/2016   Walked RA  2 laps @ 185 ft each stopped due to  Sob but no desat at nl  pace  -  05/11/2017 Patient Saturations on Room Air at Rest = 94% > while Ambulating = 85% And on  2 Liters of oxygen while Ambulating = 95%  As of 12/13/2021 >    2lpm  Hs only and prn daytime  with goal of sat s> 90% at all times  Again advised: Make sure you check your oxygen saturation  AT  your highest level of activity (not after you stop)   to be sure it stays over 90% and adjust  02 flow upward to maintain this level if needed but remember to turn it back to previous settings when you stop (to conserve your supply).          Each maintenance medication was reviewed in detail including emphasizing most importantly the difference between maintenance and prns and under what circumstances the prns are to be triggered using an action plan format where appropriate.  Total time for H and P, chart review, counseling, reviewing hfa/neb/02 device(s) and generating customized AVS unique to this office visit / same day charting = 20 min

## 2021-12-14 NOTE — Assessment & Plan Note (Signed)
Quit smoking 2009/MM Spirometry 04/11/2016  FEV1 0.75 (30%)  Ratio 43 p am spiriva dpi/ sym 160  With classic curvature  -04/11/2016  changed to spiriva respimat   - 11/23/2017  After extensive coaching inhaler device  effectiveness =    75% from a baseline of < 50% (short Ti)  - Allergy profile 11/23/17  >  Eos 0.1 /  IgE  23 RAST neg  - improved as of 12/25/2017 on trelegy > pulmonary f/u prn  - [facility] AT  Screen   12/25/17   MM / 163  - 10/29/2019  After extensive coaching inhaler device,  effectiveness =    75% (short Ti) > start breztri - 01/28/2020  After extensive coaching inhaler device,  effectiveness =    90%   Group D (now reclassified as E) in terms of symptom/risk and laba/lama/ICS  therefore appropriate rx at this point >>>  breztri and approp saba

## 2022-02-21 ENCOUNTER — Ambulatory Visit: Payer: Medicare Other | Admitting: Internal Medicine

## 2022-03-09 ENCOUNTER — Ambulatory Visit: Payer: Medicare Other | Admitting: Internal Medicine

## 2022-03-22 ENCOUNTER — Ambulatory Visit (INDEPENDENT_AMBULATORY_CARE_PROVIDER_SITE_OTHER): Payer: Medicare Other | Admitting: Internal Medicine

## 2022-03-22 ENCOUNTER — Encounter: Payer: Self-pay | Admitting: Internal Medicine

## 2022-03-22 VITALS — BP 136/90 | HR 100 | Temp 98.1°F | Ht 63.5 in | Wt 121.2 lb

## 2022-03-22 DIAGNOSIS — J9611 Chronic respiratory failure with hypoxia: Secondary | ICD-10-CM

## 2022-03-22 DIAGNOSIS — J449 Chronic obstructive pulmonary disease, unspecified: Secondary | ICD-10-CM | POA: Diagnosis not present

## 2022-03-22 MED ORDER — AZITHROMYCIN 250 MG PO TABS: ORAL_TABLET | ORAL | 0 refills | Status: DC

## 2022-03-22 NOTE — Assessment & Plan Note (Signed)
02 rx since 2007  - as of initial pulmonary eval 04/11/2016 = 2lpm hs only - 04/11/2016   Walked RA  2 laps @ 185 ft each stopped due to  Sob but no desat at nl  pace  -  05/11/2017 Patient Saturations on Room Air at Rest = 94% > while Ambulating = 85% And on  2 Liters of oxygen while Ambulating = 95%  As of 03/22/2022 >    2lpm  Hs only and prn daytime  with goal of sat s> 90% at all times         Each maintenance medication was reviewed in detail including emphasizing most importantly the difference between maintenance and prns and under what circumstances the prns are to be triggered using an action plan format where appropriate.  Total time for H and P, chart review, counseling, reviewing hfa/02/eb device(s) and generating customized AVS unique to this office visit / same day charting = 24 min

## 2022-03-22 NOTE — Patient Instructions (Signed)
Plan A = Automatic = Always=    Breztri Take 2 puffs first thing in am and then another 2 puffs about 12 hours later.     Plan B = Backup (to supplement plan A, not to replace it) Only use your albuterol inhaler as a rescue medication to be used if you can't catch your breath by resting or doing a relaxed purse lip breathing pattern.  - The less you use it, the better it will work when you need it. - Ok to use the inhaler up to 2 puffs  every 4 hours if you must but call for appointment if use goes up over your usual need - Don't leave home without it !!  (think of it like the spare tire for your car)   Plan C = Crisis (instead of Plan B but only if Plan B stops working) - only use your albuterol nebulizer if you first try Plan B and it fails to help > ok to use the nebulizer up to every 4 hours but if start needing it regularly call for immediate appointment  Zpak   Please schedule a follow up visit in 12  months but call sooner if needed

## 2022-03-22 NOTE — Assessment & Plan Note (Addendum)
Quit smoking 2009/MM Spirometry 04/11/2016  FEV1 0.75 (30%)  Ratio 43 p am spiriva dpi/ sym 160  With classic curvature  -04/11/2016  changed to spiriva respimat   - 11/23/2017  After extensive coaching inhaler device  effectiveness =    75% from a baseline of < 50% (short Ti)  - Allergy profile 11/23/17  >  Eos 0.1 /  IgE  23 RAST neg  - improved as of 12/25/2017 on trelegy > pulmonary f/u prn  - [facility] AT  Screen   12/25/17   MM / 163  - 10/29/2019  After extensive coaching inhaler device,  effectiveness =    75% (short Ti) > start breztri  03/22/2022  After extensive coaching inhaler device,  effectiveness =    80% Rec z pak for mild flare with st/ cough  though all likelihood viral   Group D (now reclassified as E) in terms of symptom/risk and laba/lama/ICS  therefore appropriate rx at this point >>>  breztri and approp saba

## 2022-03-22 NOTE — Progress Notes (Signed)
Subjective:    Patient ID: Emma Hudson, female    DOB: 13-Aug-1950,    MRN: [id number]   Brief patient profile:  41 yowf  MM/quit smoking 2009   lama/laba/ics dep since 2007 with dx of copd and no change in ex tol on 160 2bid and 2lpm hs then admitted to [facility] with ? CAP but not back to baseline so self referred to pulmonary clinic 04/11/2016    History of Present Illness  04/11/2016 1st [facility] office visit/ Terrah Decoster   Chief Complaint  Patient presents with   Pulmonary Consult    Self referral. Pt states she was dxed with COPD in 2010.  Pt states she had PNA approx 5 wks ago, and her breathing is not quite back to her normal baseline. She had already been on 2lpm o2 at bedtime since April 2017, but when she was discharged from hospital recently they advised her to use 8lpm 24/7. She is only using with sleep though, and feels daytime o2 is not needed.     90% recovered from admit still weak  Doe = MMRC2 = can't walk a nl pace on a flat grade s sob but does fine slow and flat eg  shopping ok with buggy  Has proventil rarely, not using at  all the neb  rec Plan A = Automatic = symbicort 160 Take 2 puffs first thing in am and then another 2 puffs about 12 hours later spiriva respimat 2 each am  Plan B = Backup Only use your albuterol as a rescue medication  Plan C = Crisis - only use your albuterol nebulizer if you first try Plan B     01/28/2020  f/u ov/[place] office/Litzi Binning re: GOLD3/ 02 hs only on breztri Chief Complaint  Patient presents with   Follow-up    congestion   Dyspnea:  Much more active but husband does vacuuming and mopping  Cough: minimal mucoid  In am  Sleeping: fine flat bed/ one pillow SABA use: none  02: hs 2lpm only  Rec Plan A = Automatic = Always=    Breztri Take 2 puffs first thing in am and then another 2 puffs about 12 hours later.  Plan B = Backup (to supplement plan A, not to replace it) Only use your albuterol inhaler as a rescue  medication Plan C = Crisis (instead of Plan B but only if Plan B stops working) - only use your albuterol nebulizer if you first try Plan B and it fails to help  Try albuterol 15 min before an activity that you know would make you short of breath and see if it makes any difference and if makes none then don't take it after activity unless you can't catch your breath.       03/22/2022  f/u ov/Clella Mckeel re: GOLD 3/ 02 hs and prn daytime maint on breztri   Chief Complaint  Patient presents with   Follow-up    Pt states she has a lot of arthritis and its hard to breath and walk   Dyspnea:  limited back hips bilaterally and leg pain, no better with gabapentin  Cough: min dry assoc with ST  x sev days no fever or chills  Sleeping: flat bed on side one pillow  SABA use: rarely uses 02: 2lpm hs and has POC but not using approp (titration to > 90%)  Covid status:   1st two vax  Lung cancer screening :  in program     No  obvious day to day or daytime variability or assoc excess/ purulent sputum or mucus plugs or hemoptysis or cp or chest tightness, subjective wheeze or overt sinus or hb symptoms.   Sleeping  without nocturnal  or early am exacerbation  of respiratory  c/o's or need for noct saba. Also denies any obvious fluctuation of symptoms with weather or environmental changes or other aggravating or alleviating factors except as outlined above   No unusual exposure hx or h/o childhood pna/ asthma or knowledge of premature birth.  Current Allergies, Complete Past Medical History, Past Surgical History, Family History, and Social History were reviewed in [employer] record.  ROS  The following are not active complaints unless bolded Hoarseness, sore throat, dysphagia, dental problems, itching, sneezing,  nasal congestion or discharge of excess mucus or purulent secretions, ear ache,   fever, chills, sweats, unintended wt loss or wt gain, classically pleuritic or exertional  cp,  orthopnea pnd or arm/hand swelling  or leg swelling, presyncope, palpitations, abdominal pain, anorexia, nausea, vomiting, diarrhea  or change in bowel habits or change in bladder habits, change in stools or change in urine, dysuria, hematuria,  rash, arthralgias, visual complaints, headache, numbness, weakness or ataxia or problems with walking or coordination,  change in mood or  memory.        Current Meds  Medication Sig   albuterol (PROAIR HFA) 108 (90 Base) MCG/ACT inhaler 2 puffs every 4 hours as needed only  if your can't catch your breath   albuterol (PROVENTIL) (2.5 MG/3ML) 0.083% nebulizer solution Take 2.5 mg by nebulization every 6 (six) hours as needed for wheezing or shortness of breath.   amLODipine (NORVASC) 5 MG tablet Take 5 mg by mouth daily.   Budeson-Glycopyrrol-Formoterol (BREZTRI AEROSPHERE) 160-9-4.8 MCG/ACT AERO Inhale 2 puffs into the lungs 2 (two) times daily.   famotidine (PEPCID) 20 MG tablet One after supper   irbesartan-hydrochlorothiazide (AVALIDE) 150-12.5 MG tablet Take 1 tablet by mouth daily.    Lactobacillus-Inulin (CULTURELLE DIGESTIVE DAILY) CAPS Take 1 capsule by mouth. OTC   nystatin (MYCOSTATIN) 100000 UNIT/ML suspension 1 tsp 4 x daily   OXYGEN 2lpm with sleep and exertion [facility] [place], [place]   pantoprazole (PROTONIX) 40 MG tablet TAKE 1 TABLET(40 MG) BY MOUTH DAILY 30 TO 60 MINUTES BEFORE FIRST MEAL OF THE DAY   potassium chloride (K-DUR,KLOR-CON) 10 MEQ tablet Take 10 mEq by mouth daily.   Rosuvastatin Calcium (CRESTOR PO) Take 1 tablet by mouth daily. ? strength   Vitamin D, Ergocalciferol, (DRISDOL) 50000 units CAPS capsule Take 50,000 Units by mouth every 7 (seven) days.                     Objective:   Physical Exam  Wts  03/22/2022      121   12/13/2021         121  02/21/2021      122  11/12/2020         120 01/28/2020       124  10/29/2019       126  12/25/2017       134  11/23/2017       139   05/11/17 138 lb 12.8 oz  (63 kg)  02/06/17 137 lb 3.2 oz (62.2 kg)  12/26/16 136 lb (61.7 kg)    Vital signs reviewed  03/22/2022  - Note at rest 02 sats  93% on RA   General appearance:    amb wf  nad     HEENT :  Oropharynx  clear   Nasal turbinates nl    NECK :  without JVD/Nodes/TM/ nl carotid upstrokes bilaterally   LUNGS: no acc muscle use,  Mod barrel  contour chest wall with bilateral  Distant bs s audible wheeze and  without cough on insp or exp maneuvers and mod  Hyperresonant  to  percussion bilaterally     CV:  RRR  no s3 or murmur or increase in P2, and no edema   ABD:  soft and nontender with pos mid insp Hoover's  in the supine position. No bruits or organomegaly appreciated, bowel sounds nl  MS:   Ext warm without deformities or   obvious joint restrictions , calf tenderness, cyanosis or clubbing  SKIN: warm and dry without lesions    NEURO:  alert, approp, nl sensorium with  no motor or cerebellar deficits apparent.            Assessment & Plan:

## 2022-05-24 ENCOUNTER — Ambulatory Visit (HOSPITAL_COMMUNITY): Payer: Medicare Other

## 2022-05-24 ENCOUNTER — Other Ambulatory Visit (HOSPITAL_COMMUNITY): Payer: Self-pay | Admitting: Acute Care

## 2022-05-24 ENCOUNTER — Ambulatory Visit (HOSPITAL_COMMUNITY)
Admission: RE | Admit: 2022-05-24 | Discharge: 2022-05-24 | Disposition: A | Discharge status: Home / Self Care | Payer: Medicare Other | Source: Ambulatory Visit | Attending: Acute Care | Admitting: Acute Care

## 2022-05-24 DIAGNOSIS — Z87891 Personal history of nicotine dependence: Secondary | ICD-10-CM | POA: Diagnosis present

## 2022-05-25 ENCOUNTER — Other Ambulatory Visit: Payer: Self-pay | Admitting: Acute Care

## 2022-05-25 DIAGNOSIS — Z87891 Personal history of nicotine dependence: Secondary | ICD-10-CM

## 2022-05-25 DIAGNOSIS — Z122 Encounter for screening for malignant neoplasm of respiratory organs: Secondary | ICD-10-CM

## 2022-09-16 IMAGING — CT CT CHEST LUNG CANCER SCREENING LOW DOSE W/O CM
1 series · 10 of 10 positions shown, 13 images · non-contrast
Comparison: None.

CLINICAL DATA: 70-year-old female with 31 pack-year history of
smoking. Lung cancer screening.



[ct lung segmentation data · axial · 0.62mm/px · z∈[+1250,+1250]mm · 10 of 304 frames shown]
[frame 1/304  mediastinal]
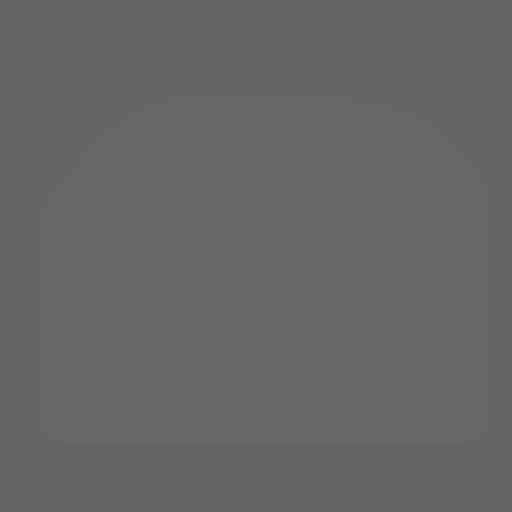
[frame 1/304  lung]
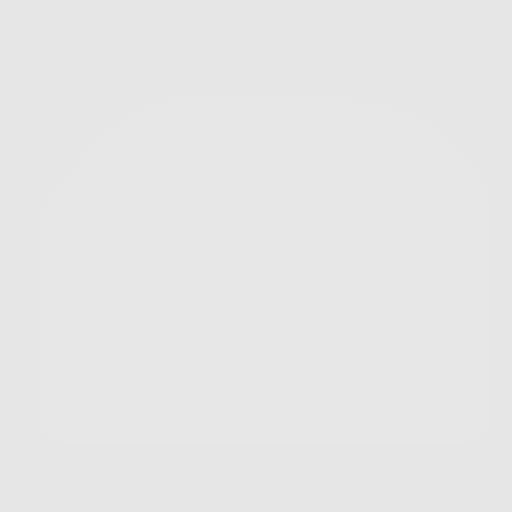
[frame 34/304  lung]
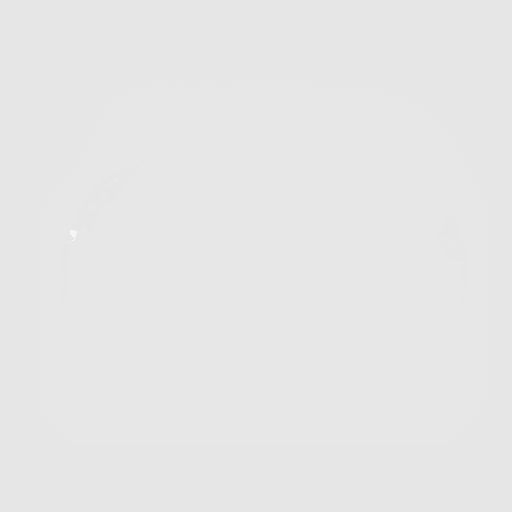
[frame 68/304  lung]
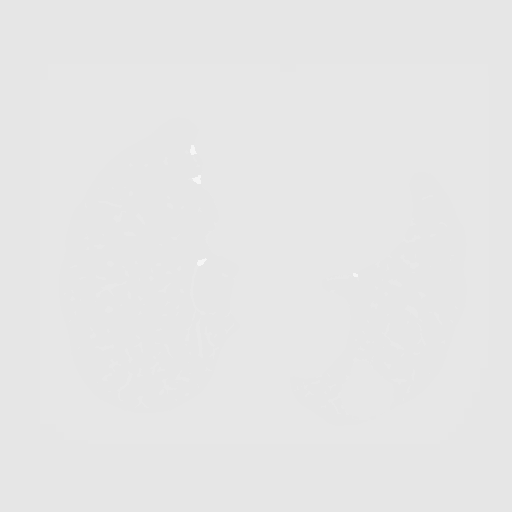
[frame 102/304  lung]
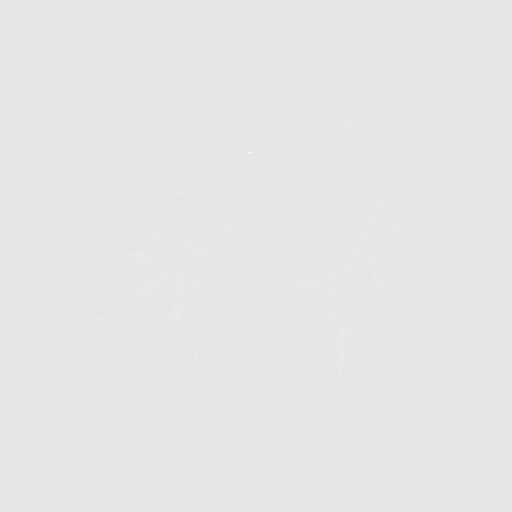
[frame 135/304  mediastinal]
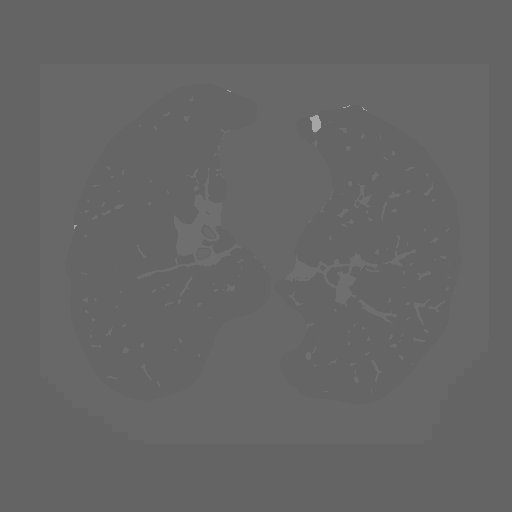
[frame 135/304  lung]
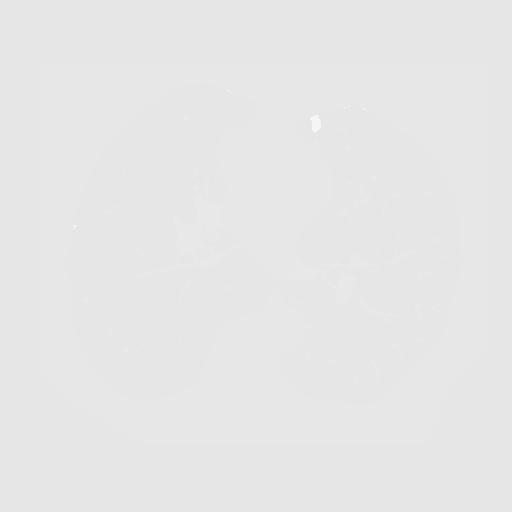
[frame 169/304  lung]
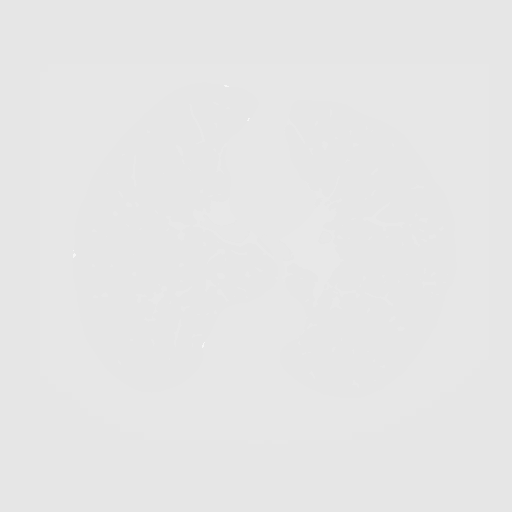
[frame 203/304  lung]
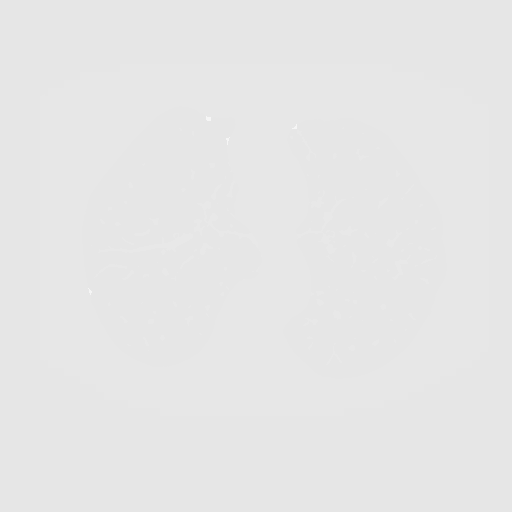
[frame 236/304  lung]
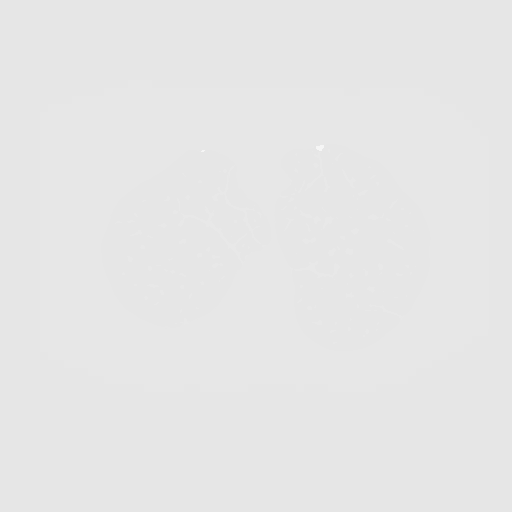
[frame 270/304  mediastinal]
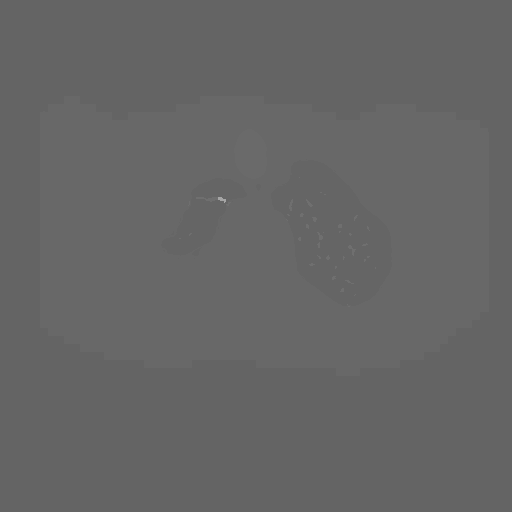
[frame 270/304  lung]
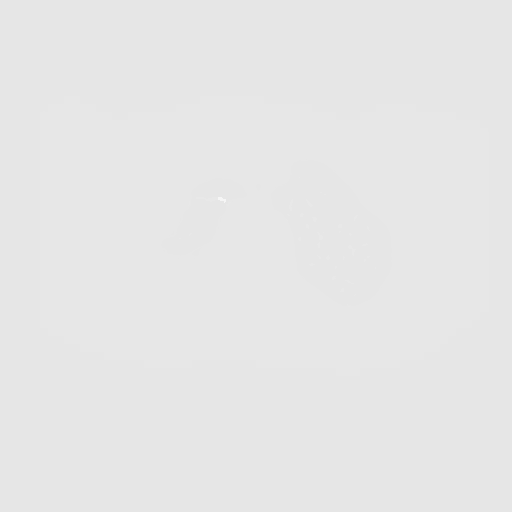
[frame 304/304  lung]
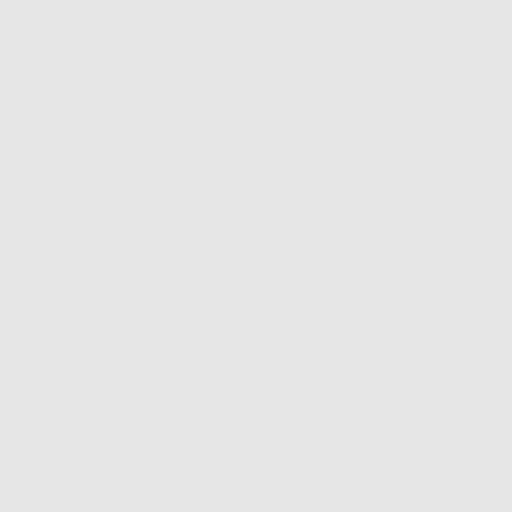

[10 of 10 positions shown; findings below may reference images not displayed]

FINDINGS: Cardiovascular: The heart size is normal. No substantial pericardial
effusion. Coronary artery calcification is evident. Mild
atherosclerotic calcification is noted in the wall of the thoracic
aorta.

Mediastinum/Nodes: No mediastinal lymphadenopathy. No evidence for
gross hilar lymphadenopathy although assessment is limited by the
lack of intravenous contrast on the current study. Small to moderate
hiatal hernia. The esophagus has normal imaging features. There is
no axillary lymphadenopathy.

Lungs/Pleura: Centrilobular and paraseptal emphysema evident.
Scattered areas of architectural distortion and scarring are noted
bilaterally. Scattered tiny bilateral pulmonary nodules are
identified measuring up to maximum volume derived equivalent
diameter of 1.9 mm. No suspicious pulmonary nodule or mass. No focal
airspace consolidation. No pleural effusion.

Upper Abdomen: 10 mm soft tissue nodule along the posterior hepatic
dome is likely benign. No adrenal nodule or mass.

Musculoskeletal: No worrisome lytic or sclerotic osseous
abnormality.
IMPRESSION: 1. Lung-RADS 2, benign appearance or behavior. Continue annual
screening with low-dose chest CT without contrast in 12 months.
2. Small to moderate hiatal hernia.
3.  Emphysema (3FO3G-Q8I.G) and Aortic Atherosclerosis (3FO3G-170.0)

## 2022-11-27 ENCOUNTER — Telehealth: Payer: Self-pay | Admitting: Internal Medicine

## 2022-11-27 NOTE — Telephone Encounter (Signed)
Needs her Breztri. States a request was sent by the Pharm.  [facility] on [address] in [place] (This is a pharmacy change.)    Her number is [phone]

## 2022-11-28 NOTE — Telephone Encounter (Signed)
PT calling again for [facility] refill.

## 2022-11-29 MED ORDER — BREZTRI AEROSPHERE 160-9-4.8 MCG/ACT IN AERO: 2.0000 | INHALATION_SPRAY | Freq: Two times a day (BID) | RESPIRATORY_TRACT | 11 refills | Status: DC

## 2022-11-29 NOTE — Telephone Encounter (Signed)
Rx sent to pharmacy requested.

## 2023-03-27 ENCOUNTER — Other Ambulatory Visit: Payer: Self-pay | Admitting: Acute Care

## 2023-03-27 ENCOUNTER — Ambulatory Visit: Payer: Medicare Other | Admitting: Internal Medicine

## 2023-03-27 DIAGNOSIS — Z87891 Personal history of nicotine dependence: Secondary | ICD-10-CM

## 2023-03-27 DIAGNOSIS — Z122 Encounter for screening for malignant neoplasm of respiratory organs: Secondary | ICD-10-CM

## 2023-04-01 NOTE — Progress Notes (Deleted)
Subjective:    Patient ID: Nira Niland, female    DOB: [birth year],    MRN: [id number]   Brief patient profile:  35 yowf  MM/quit smoking 2009   lama/laba/ics dep since 2007 with dx of copd and no change in ex tol on 160 2bid and 2lpm hs then admitted to [facility] with ? CAP but not back to baseline so self referred to pulmonary clinic 04/11/2016    History of Present Illness  04/11/2016 1st [facility] office visit/ Magdalen Cabana   Chief Complaint  Patient presents with   Pulmonary Consult    Self referral. Pt states she was dxed with COPD in 2010.  Pt states she had PNA approx 5 wks ago, and her breathing is not quite back to her normal baseline. She had already been on 2lpm o2 at bedtime since April 2017, but when she was discharged from hospital recently they advised her to use 8lpm 24/7. She is only using with sleep though, and feels daytime o2 is not needed.     90% recovered from admit still weak  Doe = MMRC2 = can't walk a nl pace on a flat grade s sob but does fine slow and flat eg  shopping ok with buggy  Has proventil rarely, not using at  all the neb  rec Plan A = Automatic = symbicort 160 Take 2 puffs first thing in am and then another 2 puffs about 12 hours later spiriva respimat 2 each am  Plan B = Backup Only use your albuterol as a rescue medication  Plan C = Crisis - only use your albuterol nebulizer if you first try Plan B     03/22/2022  f/u ov/Pastor Sgro re: GOLD 3/ 02 hs and prn daytime maint on breztri   Chief Complaint  Patient presents with   Follow-up    Pt states she has a lot of arthritis and its hard to breath and walk   Dyspnea:  limited back hips bilaterally and leg pain, no better with gabapentin  Cough: min dry assoc with ST  x sev days no fever or chills  Sleeping: flat bed on side one pillow  SABA use: rarely uses 02: 2lpm hs and has POC but not using approp (titration to > 90%)  Covid status:   1st two vax  Lung cancer screening :  in program    Rec Plan A = Automatic = Always=    Breztri Take 2 puffs first thing in am and then another 2 puffs about 12 hours later.   Plan B = Backup (to supplement plan A, not to replace it) Only use your albuterol inhaler as a rescue medication  Plan C = Crisis (instead of Plan B but only if Plan B stops working) - only use your albuterol nebulizer if you first try Plan B  Zpak         04/02/2023  yearly f/u ov/[place] office/Kyriakos Babler re: GOLD 3 copd/ 02 HS and prn maint on ***  No chief complaint on file.   Dyspnea:  *** Cough: *** Sleeping: ***   resp cc  SABA use: *** 02: ***  Lung cancer screening: ***   No obvious day to day or daytime variability or assoc excess/ purulent sputum or mucus plugs or hemoptysis or cp or chest tightness, subjective wheeze or overt sinus or hb symptoms.    Also denies any obvious fluctuation of symptoms with weather or environmental changes or other aggravating or alleviating factors except as  outlined above   No unusual exposure hx or h/o childhood pna/ asthma or knowledge of premature birth.  Current Allergies, Complete Past Medical History, Past Surgical History, Family History, and Social History were reviewed in [employer] record.  ROS  The following are not active complaints unless bolded Hoarseness, sore throat, dysphagia, dental problems, itching, sneezing,  nasal congestion or discharge of excess mucus or purulent secretions, ear ache,   fever, chills, sweats, unintended wt loss or wt gain, classically pleuritic or exertional cp,  orthopnea pnd or arm/hand swelling  or leg swelling, presyncope, palpitations, abdominal pain, anorexia, nausea, vomiting, diarrhea  or change in bowel habits or change in bladder habits, change in stools or change in urine, dysuria, hematuria,  rash, arthralgias, visual complaints, headache, numbness, weakness or ataxia or problems with walking or coordination,  change in mood or  memory.         No outpatient medications have been marked as taking for the 04/02/23 encounter (Appointment) with Nyoka Cowden, MD.                  Objective:   Physical Exam  Wts  04/02/2023       ***  03/22/2022      121   12/13/2021         121  02/21/2021      122  11/12/2020         120 01/28/2020       124  10/29/2019       126  12/25/2017       134  11/23/2017       139   05/11/17 138 lb 12.8 oz (63 kg)  02/06/17 137 lb 3.2 oz (62.2 kg)  12/26/16 136 lb (61.7 kg)    Vital signs reviewed  04/02/2023  - Note at rest 02 sats  ***% on ***   General appearance:    ***   Mod barr***             Assessment & Plan:

## 2023-04-02 ENCOUNTER — Ambulatory Visit: Payer: Medicare Other | Admitting: Internal Medicine

## 2023-04-22 NOTE — Progress Notes (Unsigned)
Subjective:    Patient ID: Emma Hudson, female    DOB: 01-03-1951,    MRN: [id number]  Brief patient profile:  27 yowf  MM/quit smoking 2009   lama/laba/ics dep since 2007 with dx of copd and no change in ex tol on 160 2bid and 2lpm hs then admitted to [facility] with ? CAP but not back to baseline so self referred to pulmonary clinic 04/11/2016   Spirometry 04/11/2016  FEV1 0.75 (30%)  Ratio 43 c/w GOLD 3 copd    History of Present Illness  04/11/2016 1st [facility] office visit/ Mandela Bello   Chief Complaint  Patient presents with   Pulmonary Consult    Self referral. Pt states she was dxed with COPD in 2010.  Pt states she had PNA approx 5 wks ago, and her breathing is not quite back to her normal baseline. She had already been on 2lpm o2 at bedtime since April 2017, but when she was discharged from hospital recently they advised her to use 8lpm 24/7. She is only using with sleep though, and feels daytime o2 is not needed.     90% recovered from admit still weak  Doe = MMRC2 = can't walk a nl pace on a flat grade s sob but does fine slow and flat eg  shopping ok with buggy  Has proventil rarely, not using at  all the neb  rec Plan A = Automatic = symbicort 160 Take 2 puffs first thing in am and then another 2 puffs about 12 hours later spiriva respimat 2 each am  Plan B = Backup Only use your albuterol as a rescue medication  Plan C = Crisis - only use your albuterol nebulizer if you first try Plan B     03/22/2022  f/u ov/Suzette Flagler re: GOLD 3/ 02 hs and prn daytime maint on breztri   Chief Complaint  Patient presents with   Follow-up    Pt states she has a lot of arthritis and its hard to breath and walk   Dyspnea:  limited back hips bilaterally and leg pain, no better with gabapentin  Cough: min dry assoc with ST  x sev days no fever or chills  Sleeping: flat bed on side one pillow  SABA use: rarely uses 02: 2lpm hs and has POC but not using approp (titration to > 90%)  Covid  status:   1st two vax  Lung cancer screening :  in program   Rec Plan A = Automatic = Always=    Breztri Take 2 puffs first thing in am and then another 2 puffs about 12 hours later.   Plan B = Backup (to supplement plan A, not to replace it) Only use your albuterol inhaler as a rescue medication  Plan C = Crisis (instead of Plan B but only if Plan B stops working) - only use your albuterol nebulizer if you first try Plan B  Zpak     04/23/2023  f/u ov/[place] office/Natan Hartog re: GOLD 3 copd  maint on breztri   Chief Complaint  Patient presents with   Follow-up    1  Year follow up /copd   Dyspnea:  more limited by back and legs than doe Cough: min dry  Sleeping: flat bed one pillow s    resp cc  SABA use: just hfa/ maybe once a week  02: 2lpm hs / prn daytime  Lung cancer screening: in program / wt loss w/u neg including gi eval   No  obvious day to day or daytime variability or assoc excess/ purulent sputum or mucus plugs or hemoptysis or cp or chest tightness, subjective wheeze or overt sinus or hb symptoms.    Also denies any obvious fluctuation of symptoms with weather or environmental changes or other aggravating or alleviating factors except as outlined above   No unusual exposure hx or h/o childhood pna/ asthma or knowledge of premature birth.  Current Allergies, Complete Past Medical History, Past Surgical History, Family History, and Social History were reviewed in [employer] record.  ROS  The following are not active complaints unless bolded Hoarseness, sore throat, dysphagia, dental problems, itching, sneezing,  nasal congestion or discharge of excess mucus or purulent secretions, ear ache,   fever, chills, sweats, unintended wt loss or wt gain, classically pleuritic or exertional cp,  orthopnea pnd or arm/hand swelling  or leg swelling, presyncope, palpitations, abdominal pain, anorexia, nausea, vomiting, diarrhea  or change in bowel habits or  change in bladder habits, change in stools or change in urine, dysuria, hematuria,  rash, arthralgias, visual complaints, headache, numbness, weakness or ataxia or problems with walking or coordination,  change in mood or  memory.        Current Meds  Medication Sig   albuterol (PROVENTIL) (2.5 MG/3ML) 0.083% nebulizer solution Take 2.5 mg by nebulization every 6 (six) hours as needed for wheezing or shortness of breath.   amLODipine (NORVASC) 5 MG tablet Take 5 mg by mouth daily.   Budeson-Glycopyrrol-Formoterol (BREZTRI AEROSPHERE) 160-9-4.8 MCG/ACT AERO Inhale 2 puffs into the lungs 2 (two) times daily.   famotidine (PEPCID) 20 MG tablet One after supper   OXYGEN 2lpm with sleep and exertion [facility] [place]   pantoprazole (PROTONIX) 40 MG tablet TAKE 1 TABLET(40 MG) BY MOUTH DAILY 30 TO 60 MINUTES BEFORE FIRST MEAL OF THE DAY   potassium chloride (K-DUR,KLOR-CON) 10 MEQ tablet Take 10 mEq by mouth daily.   Rosuvastatin Calcium (CRESTOR PO) Take 1 tablet by mouth daily. ? strength   Vitamin D, Ergocalciferol, (DRISDOL) 50000 units CAPS capsule Take 50,000 Units by mouth every 7 (seven) days.                Objective:   Physical Exam  Wts  04/23/2023      111  03/22/2022      121   12/13/2021         121  02/21/2021      122  11/12/2020         120 01/28/2020       124  10/29/2019       126  12/25/2017       134  11/23/2017       139   05/11/17 138 lb 12.8 oz (63 kg)  02/06/17 137 lb 3.2 oz (62.2 kg)  12/26/16 136 lb (61.7 kg)    Vital signs reviewed  04/02/2023  - Note at rest 02 sats  93% on RA   General appearance:    amb wf nad   HEENT :  Oropharynx  clear   Nasal turbinates nl    NECK :  without JVD/Nodes/TM/ nl carotid upstrokes bilaterally   LUNGS: no acc muscle use,  Mod barrel  contour chest wall with bilateral  Distant bs s audible wheeze and  without cough on insp or exp maneuvers and mod  Hyperresonant  to  percussion bilaterally     CV:  RRR  no s3  or murmur or  increase in P2, and no edema   ABD:  soft and nontender    MS:   Ext warm without deformities or   obvious joint restrictions , calf tenderness, cyanosis or clubbing  SKIN: warm and dry without lesions    NEURO:  alert, approp, nl sensorium with  no motor or cerebellar deficits apparent.            Assessment & Plan:

## 2023-04-23 ENCOUNTER — Encounter: Payer: Self-pay | Admitting: Internal Medicine

## 2023-04-23 ENCOUNTER — Ambulatory Visit: Payer: Medicare Other | Admitting: Internal Medicine

## 2023-04-23 VITALS — BP 126/76 | HR 106 | Ht 63.5 in | Wt 112.0 lb

## 2023-04-23 DIAGNOSIS — J9611 Chronic respiratory failure with hypoxia: Secondary | ICD-10-CM

## 2023-04-23 DIAGNOSIS — J449 Chronic obstructive pulmonary disease, unspecified: Secondary | ICD-10-CM

## 2023-04-23 NOTE — Assessment & Plan Note (Signed)
Quit smoking 2009/MM Spirometry 04/11/2016  FEV1 0.75 (30%)  Ratio 43 p am spiriva dpi/ sym 160  With classic curvature  -04/11/2016  changed to spiriva respimat   - Allergy profile 11/23/17  >  Eos 0.1 /  IgE  23 RAST neg  - improved as of 12/25/2017 on trelegy > pulmonary f/u prn  - [employer] [facility] AT  Screen   12/25/17   MM / 163  - 10/29/2019  >>>   start breztri - 01/28/2020  After extensive coaching inhaler device,  effectiveness =    90%    Group D (now reclassified as E) in terms of symptom/risk and laba/lama/ICS  therefore appropriate rx at this point >>>  breztri and approp saba

## 2023-04-23 NOTE — Patient Instructions (Signed)
No change in recommendations  Please schedule a follow up visit in 12 months but call sooner if needed  

## 2023-04-23 NOTE — Assessment & Plan Note (Addendum)
02 rx since 2007  - as of initial pulmonary eval 04/11/2016 = 2lpm hs only - 04/11/2016   Walked RA  2 laps @ 185 ft each stopped due to  Sob but no desat at nl  pace  -  05/11/2017 Patient Saturations on Room Air at Rest = 94% > while Ambulating = 85% And on  2 Liters of oxygen while Ambulating = 95%    As of 04/23/2023  >  2lpm hs and prn / goal is > 90% sats   F/u can be yearly, sooner prn    Each maintenance medication was reviewed in detail including emphasizing most importantly the difference between maintenance and prns and under what circumstances the prns are to be triggered using an action plan format where appropriate.  Total time for H and P, chart review, counseling, reviewing hfa/02 / pulse ox device(s) and generating customized AVS unique to this office visit / same day charting = 

## 2023-05-04 ENCOUNTER — Telehealth: Payer: Self-pay | Admitting: Internal Medicine

## 2023-05-04 NOTE — Telephone Encounter (Signed)
Fax received from Dr. Italy Albright with [facility] to perform a Phaco w PC/IOL on patient.  Patient needs surgery clearance. Surgery is 06/06/23. Patient was seen on 04/23/23 . Office protocol is a risk assessment can be sent to surgeon if patient has been seen in 60 days or less.   Sending to Dr Sherene Sires for risk assessment or recommendations if patient needs to be seen in office prior to surgical procedure.

## 2023-05-07 NOTE — Telephone Encounter (Signed)
Cancel previous entry  - I need to know what surgery she's having before I can clear her

## 2023-05-07 NOTE — Telephone Encounter (Signed)
So yes she is cleared for eye surgery but I would be sure they have have at least a nurse anesthetist involved in the case as she is mild risk resp failure

## 2023-05-07 NOTE — Telephone Encounter (Signed)
It's a PHACO w PC/IOL procedure

## 2023-05-09 DIAGNOSIS — H269 Unspecified cataract: Secondary | ICD-10-CM

## 2023-05-09 HISTORY — DX: Unspecified cataract: H26.9

## 2023-05-11 NOTE — Telephone Encounter (Signed)
 Copy of last ov note and this telephone encounter was faxed to Dr Gloris Manchester.

## 2023-05-25 ENCOUNTER — Telehealth: Payer: Self-pay | Admitting: Internal Medicine

## 2023-05-25 ENCOUNTER — Other Ambulatory Visit (HOSPITAL_COMMUNITY): Payer: Self-pay

## 2023-05-25 ENCOUNTER — Telehealth: Payer: Self-pay

## 2023-05-25 NOTE — Telephone Encounter (Signed)
PA request has been Cancelled. New Encounter created for follow up. For additional info see Pharmacy Prior Auth telephone encounter from 01/17.

## 2023-05-25 NOTE — Telephone Encounter (Signed)
Patient needs Prior Auth for Emma Hudson to be to be faxed to pharmacy.  Pharmacy: [facility] [place]

## 2023-05-25 NOTE — Telephone Encounter (Signed)
Pharmacy Patient Advocate Encounter   Received notification from Pt Calls Messages that prior authorization for Markus Daft is required/requested.   Insurance verification completed.   The patient is insured through [facility] .   Per test claim: The current 30 day co-pay is, $47.00.  No PA needed at this time. This test claim was processed through [facility]- copay amounts may vary at other pharmacies due to pharmacy/plan contracts, or as the patient moves through the different stages of their insurance plan.

## 2023-05-28 ENCOUNTER — Ambulatory Visit (HOSPITAL_COMMUNITY)
Admission: RE | Admit: 2023-05-28 | Discharge: 2023-05-28 | Disposition: A | Discharge status: Home / Self Care | Payer: Medicare Other | Source: Ambulatory Visit | Attending: Acute Care | Admitting: Acute Care

## 2023-05-28 DIAGNOSIS — R918 Other nonspecific abnormal finding of lung field: Secondary | ICD-10-CM | POA: Diagnosis not present

## 2023-05-28 DIAGNOSIS — Z87891 Personal history of nicotine dependence: Secondary | ICD-10-CM

## 2023-05-28 DIAGNOSIS — J439 Emphysema, unspecified: Secondary | ICD-10-CM | POA: Diagnosis not present

## 2023-05-28 DIAGNOSIS — I700 Atherosclerosis of aorta: Secondary | ICD-10-CM | POA: Diagnosis not present

## 2023-05-28 DIAGNOSIS — I2510 Atherosclerotic heart disease of native coronary artery without angina pectoris: Secondary | ICD-10-CM | POA: Diagnosis not present

## 2023-05-28 DIAGNOSIS — Z122 Encounter for screening for malignant neoplasm of respiratory organs: Secondary | ICD-10-CM | POA: Diagnosis present

## 2023-06-04 NOTE — Telephone Encounter (Signed)
Contacted patient to be sure she was aware. She has picked up [employer]. NFN

## 2023-06-06 ENCOUNTER — Telehealth: Payer: Self-pay | Admitting: Acute Care

## 2023-06-06 DIAGNOSIS — Z122 Encounter for screening for malignant neoplasm of respiratory organs: Secondary | ICD-10-CM

## 2023-06-06 DIAGNOSIS — R911 Solitary pulmonary nodule: Secondary | ICD-10-CM

## 2023-06-06 DIAGNOSIS — Z87891 Personal history of nicotine dependence: Secondary | ICD-10-CM

## 2023-06-06 NOTE — Telephone Encounter (Signed)
Spoke with Emma Hudson - Will need to call and speak with patient regarding scan results. Will need to inquire if pt has been sick. If so, patient will need to reach out to their PCP for treatment. Radiology feels the results are related to infectious or inflammatory processes. Patient will need a 3 month follow up scan, due 08/27/2023.    IMPRESSION: 1. Today's study demonstrates new nodular areas of architectural distortion, with an overall appearance which favors an infectious or inflammatory etiology limiting today's study resulting categorization as Lung-RADS 0S, incomplete. Short-term follow-up in 3 months is recommended with repeat low-dose chest CT without contrast (please use the following order, "CT CHEST LCS NODULE FOLLOW-UP W/O CM"). 2. The "S" modifier above refers to potentially clinically significant non lung cancer related findings. Specifically, there is aortic atherosclerosis, in addition to left main and three-vessel coronary artery disease. Please note that although the presence of coronary artery calcium documents the presence of coronary artery disease, the severity of this disease and any potential stenosis cannot be assessed on this non-gated CT examination. Assessment for potential risk factor modification, dietary therapy or pharmacologic therapy may be warranted, if clinically indicated. 3. Mild diffuse bronchial wall thickening with moderate centrilobular and paraseptal emphysema; imaging findings suggestive of underlying COPD.   Aortic Atherosclerosis (ICD10-I70.0) and Emphysema (ICD10-J43.9).     Electronically Signed   By: Trudie Reed M.D.   On: 06/04/2023 09:42

## 2023-06-07 NOTE — Telephone Encounter (Signed)
Spoke with patient. Patient states she had been sick around the time of completing her LDCT. States she saw her PCP and it was a virus. States she is still not feeling 100% but is out of bed. States "I just don't feel like I am breathing as well as normal". Pt in NAD at time of call. Speaking in full sentences. Patient is going to f/u with PCP today and discuss results of LDCT and if an antibiotic is needed due to continued symptoms and CT results. No chest xray completed at initial visit. Pt is in agreement to 3 month follow up. Results and plan sent to PCP. Order placed for 3 month f/u CT.

## 2023-09-11 ENCOUNTER — Ambulatory Visit (HOSPITAL_COMMUNITY)

## 2023-10-02 ENCOUNTER — Ambulatory Visit (HOSPITAL_COMMUNITY): Admission: RE | Admit: 2023-10-02 | Source: Ambulatory Visit

## 2023-10-04 NOTE — Telephone Encounter (Signed)
 Pt cancelled 10/02/2023 3 month f/u scan and rescheduled for 12/19/2023. Pt is aware that a 3 month f/u was suggested.

## 2023-10-30 ENCOUNTER — Other Ambulatory Visit: Payer: Self-pay | Admitting: Internal Medicine

## 2023-12-19 ENCOUNTER — Ambulatory Visit (HOSPITAL_COMMUNITY)
Admission: RE | Admit: 2023-12-19 | Discharge: 2023-12-19 | Disposition: A | Discharge status: Home / Self Care | Source: Ambulatory Visit | Attending: Acute Care | Admitting: Acute Care

## 2023-12-19 DIAGNOSIS — Z122 Encounter for screening for malignant neoplasm of respiratory organs: Secondary | ICD-10-CM | POA: Diagnosis present

## 2023-12-19 DIAGNOSIS — Z87891 Personal history of nicotine dependence: Secondary | ICD-10-CM | POA: Insufficient documentation

## 2023-12-19 DIAGNOSIS — R911 Solitary pulmonary nodule: Secondary | ICD-10-CM | POA: Insufficient documentation

## 2024-01-09 ENCOUNTER — Telehealth: Payer: Self-pay

## 2024-01-09 NOTE — Telephone Encounter (Signed)
 Call Report from Tiffany:    IMPRESSION: 1. Waxing and waning peribronchovascular nodularity and consolidation with new areas of nodular consolidation measuring up to 7.3 mm in the posterior right lower lobe. 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 Patient may be a poor candidate for lung cancer screening. Because malignancy cannot be excluded, exam is categorized as Lung-RADS 4A, suspicious. Follow up low-dose chest CT without contrast in 3 months (please use the following order, CT CHEST LCS NODULE FOLLOW-UP W/O CM) is recommended. These results will be called to the ordering clinician or representative by the Radiologist Assistant, and communication documented in the PACS or [employer]. 2. Aortic atherosclerosis (ICD10-I70.0). Coronary artery calcification. 3.  Emphysema (ICD10-J43.9).

## 2024-01-11 ENCOUNTER — Telehealth: Payer: Self-pay | Admitting: Acute Care

## 2024-01-11 NOTE — Telephone Encounter (Signed)
 See provider note 01/10/2024

## 2024-01-11 NOTE — Telephone Encounter (Signed)
 This scan is a 63-month follow-up.  Now reads as a 4A. Please schedule for 5-month follow-up and needs to be seen by Zelda Salmon lung nodule Dr. After that scan to reevaluate and if continuous new nodules they will need to be  evaluated for MAC./ MAI. Thanks so much

## 2024-01-14 ENCOUNTER — Other Ambulatory Visit: Payer: Self-pay

## 2024-01-14 DIAGNOSIS — Z122 Encounter for screening for malignant neoplasm of respiratory organs: Secondary | ICD-10-CM

## 2024-01-14 DIAGNOSIS — R911 Solitary pulmonary nodule: Secondary | ICD-10-CM

## 2024-01-14 DIAGNOSIS — Z87891 Personal history of nicotine dependence: Secondary | ICD-10-CM

## 2024-01-14 NOTE — Telephone Encounter (Signed)
 Spoke with patient and reviewed results. She is in agreement to a 3 month follow up scan. Order placed and she will be contacted closer to the due date to schedule per patient request. She is also in agreement to a follow up appt to review results. Results and plan to PCP.   Lauraine, can you just clarify what provider you would like her to follow up with?  Isaiah

## 2024-01-24 ENCOUNTER — Telehealth: Payer: Self-pay

## 2024-01-24 NOTE — Telephone Encounter (Signed)
 See encounter previous encounter.

## 2024-01-24 NOTE — Telephone Encounter (Signed)
 Returned patient call. 3 month follow up scan has been scheduled for 03/25/2024 and f/u appt to review results has been scheduled for 04/01/2024 with Dr. Deedee in [place].

## 2024-01-30 ENCOUNTER — Ambulatory Visit: Payer: Self-pay | Admitting: Internal Medicine

## 2024-01-30 NOTE — Telephone Encounter (Signed)
 FYI Only or Action Required?: Action required by provider: clinical question for provider, update on patient condition, and requesting antibiotics for cough and to see if lungs clear up for next CT lung screening in November per patient.  Patient is followed in Pulmonology for COPD, last seen on 04/23/2023 by Emma Ozell NOVAK, MD.  Called Nurse Triage reporting Cough.  Symptoms began about a year ago.  Interventions attempted: OTC medications: cough/sore throat medication, Maintenance inhaler, and Home oxygen use.  Symptoms are: unchanged.  Triage Disposition: See PCP When Office is Open (Within 3 Days)  Patient/caregiver understands and will follow disposition?: No, wishes to speak with Pulmonology Team       Copied from CRM [id number]. Topic: Clinical - Red Word Triage >> Jan 30, 2024  2:01 PM Emma Hudson wrote: Red Word that prompted transfer to Nurse Triage: Patient [phone] had lung cancer screening 12/19/23 in [facility] and imaging shows a nodule. Patient spoke with Emma Hudson and was advised to take an antibiotics to see if it will clear it up for the next CT lung cancer screening. Patient states pcp will not prescribe antibiotics and advised patient to have it done from [name]. Patient states feels harsh, chest congestion, coughing a tiny phlegm hard to do, not loose enough, COPD- shortness of breath all the time, but noticed a difference maybe more shortness of breath, patient is on oxygen. Patient denies dizziness, vision issues, pain, nor fever. Please advise. Reason for Disposition  Cough has been present for > 3 weeks  Answer Assessment - Initial Assessment Questions E2C2 Pulmonary Triage - Initial Assessment Questions Chief Complaint (e.g., cough, sob, wheezing, fever, chills, sweat or additional symptoms) *Go to specific symptom protocol after initial questions. Cough---mostly dry,   green sputum,   How long have symptoms been present? All year  Have you tested  for COVID or Flu? Note: If not, ask patient if a home test can be taken. If so, instruct patient to call back for positive results. No  MEDICINES:   Have you used any OTC meds to help with symptoms? Yes If yes, ask What medications? Cough/sore throat over the counter medications  Have you used your inhalers/maintenance medication? Yes If yes, What medications?  Breztri  Inhaler--INHALE 2 PUFFS INTO THE LUNGS TWICE DAILY   If inhaler, ask How many puffs and how often? Note: Review instructions on medication in the chart.  Breztri  Inhaler--INHALE 2 PUFFS INTO THE LUNGS TWICE DAILY   OXYGEN: Do you wear supplemental oxygen? Yes If yes, How many liters are you supposed to use? 2  Do you monitor your oxygen levels? Yes If yes, What is your reading (oxygen level) today? 94-95 on a bad day but 96% on a good day  What is your usual oxygen saturation reading?  (Note: Pulmonary O2 sats should be 90% or greater) N/a   3. SPUTUM: Describe the color of your sputum (e.g., none, dry cough; clear, white, yellow, green)     green 4. HEMOPTYSIS: Are you coughing up any blood? If Yes, ask: How much? (e.g., flecks, streaks, tablespoons, etc.)     no 5. DIFFICULTY BREATHING: Are you having difficulty breathing? If Yes, ask: How bad is it? (e.g., mild, moderate, severe)      mild 6. FEVER: Do you have a fever? If Yes, ask: What is your temperature, how was it measured, and when did it start?     no 7. CARDIAC HISTORY: Do you have any history of heart disease? (e.g.,  heart attack, congestive heart failure)      Patient denies 8. LUNG HISTORY: Do you have any history of lung disease?  (e.g., pulmonary embolus, asthma, emphysema)     COPD, lung nodule 9. PE RISK FACTORS: Do you have a history of blood clots? (or: recent major surgery, recent prolonged travel, bedridden)     Patient denies 10. OTHER SYMPTOMS: Do you have any other symptoms? (e.g., runny nose,  wheezing, chest pain)       Congestion, cough  Protocols used: Cough - Acute Non-Productive-A-AH

## 2024-01-30 NOTE — Telephone Encounter (Signed)
Please advise on rx.

## 2024-01-31 NOTE — Telephone Encounter (Signed)
 I called and spoke with patient, provided recommendations per [name].  I offered her the appointment in [place] for today at 10:45 am, however she stated she would not be able to come in today.  His  next available was 10/6 at 10:30 am, advised she would need to arrive at 10:15 am with all of her medications so he could review what she is taking.  She was agreeable to that time.  Advised we would see her at the [place] office on 02/11/24 at 10:15 am for a 10:30 appointment and she is to being all of her medications to the appointment.  She verbalized understanding.  Nothing further needed.

## 2024-01-31 NOTE — Telephone Encounter (Signed)
 This is a chronic problem we're not going to be able to solve over the phone so needs ov asap first available but with all meds in hand

## 2024-02-01 ENCOUNTER — Telehealth: Payer: Self-pay

## 2024-02-01 NOTE — Telephone Encounter (Signed)
 Copied from CRM #[id number]. Topic: General - Other >> Jan 30, 2024 12:00 PM Whitney O wrote: Reason for CRM:  niki Sic nurse from [facility] calling to see if patient   pulmonologist was treating her for a certain bacteria. Because pcp can't treat her for this so she is needing some antibiotics  Please give her a call back  [phone] ask for wills nurse leandra  X 1 Tried to reach out to patient VM/ LM to return call.

## 2024-02-04 NOTE — Telephone Encounter (Signed)
 X  2 VM/ LM

## 2024-02-11 ENCOUNTER — Encounter: Payer: Self-pay | Admitting: Internal Medicine

## 2024-02-11 ENCOUNTER — Ambulatory Visit: Admitting: Internal Medicine

## 2024-02-11 VITALS — BP 142/83 | HR 99 | Ht 63.5 in | Wt 104.4 lb

## 2024-02-11 DIAGNOSIS — J9611 Chronic respiratory failure with hypoxia: Secondary | ICD-10-CM | POA: Diagnosis not present

## 2024-02-11 DIAGNOSIS — R918 Other nonspecific abnormal finding of lung field: Secondary | ICD-10-CM | POA: Insufficient documentation

## 2024-02-11 DIAGNOSIS — J449 Chronic obstructive pulmonary disease, unspecified: Secondary | ICD-10-CM

## 2024-02-11 MED ORDER — DOXYCYCLINE HYCLATE 100 MG PO TABS: ORAL_TABLET | ORAL | 0 refills | Status: AC

## 2024-02-11 NOTE — Progress Notes (Signed)
 Subjective:    Patient ID: Emma Hudson, female    DOB: Sep 25, 1950,    MRN: [id number]  Brief patient profile:  [id number]  MM/quit smoking 2009   lama/laba/ics dep since 2007 with dx of copd and no change in ex tol on 160 2bid and 2lpm hs then admitted to [facility] with ? CAP but not back to baseline so self referred to pulmonary clinic 04/11/2016   Spirometry 04/11/2016  FEV1 0.75 (30%)  Ratio 43 c/w GOLD 3 copd    History of Present Illness  04/11/2016 1st [facility] office visit/ Dusty Raczkowski   Chief Complaint  Patient presents with   Pulmonary Consult    Self referral. Pt states she was dxed with COPD in 2010.  Pt states she had PNA approx 5 wks ago, and her breathing is not quite back to her normal baseline. She had already been on 2lpm o2 at bedtime since April 2017, but when she was discharged from hospital recently they advised her to use 8lpm 24/7. She is only using with sleep though, and feels daytime o2 is not needed.     90% recovered from admit still weak  Doe = MMRC2 = can't walk a nl pace on a flat grade s sob but does fine slow and flat eg  shopping ok with buggy  Has proventil  rarely, not using at  all the neb  rec Plan A = Automatic = symbicort  160 Take 2 puffs first thing in am and then another 2 puffs about 12 hours later spiriva  respimat 2 each am  Plan B = Backup Only use your albuterol  as a rescue medication  Plan C = Crisis - only use your albuterol  nebulizer if you first try Plan B     03/22/2022  f/u ov/Emmanuell Kantz re: GOLD 3/ 02 hs and prn daytime maint on breztri    Chief Complaint  Patient presents with   Follow-up    Pt states she has a lot of arthritis and its hard to breath and walk   Dyspnea:  limited back hips bilaterally and leg pain, no better with gabapentin  Cough: min dry assoc with ST  x sev days no fever or chills  Sleeping: flat bed on side one pillow  SABA use: rarely uses 02: 2lpm hs and has POC but not using approp (titration to > 90%)  Covid  status:   1st two vax  Lung cancer screening :  in program   Rec Plan A = Automatic = Always=    Breztri  Take 2 puffs first thing in am and then another 2 puffs about 12 hours later.   Plan B = Backup (to supplement plan A, not to replace it) Only use your albuterol  inhaler as a rescue medication  Plan C = Crisis (instead of Plan B but only if Plan B stops working) - only use your albuterol  nebulizer if you first try Plan B  Zpak     04/23/2023  f/u ov/[place] office/Avanti Jetter re: GOLD 3 copd  maint on breztri    Chief Complaint  Patient presents with   Follow-up    1  Year follow up /copd   Dyspnea:  more limited by back and legs than doe Cough: min dry  Sleeping: flat bed one pillow s    resp cc  SABA use: just hfa/ maybe once a week  02: 2lpm hs / prn daytime  Lung cancer screening: in program / wt loss w/u neg including gi eval Rec No change rx  12/19/23 LDSCT  Waxing and waning peribronchovascular nodularity and consolidation with new areas of nodular consolidation measuring up to 7.3 mm in the posterior right lower lobe. 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 Emphysema   02/11/2024  f/u ov/[place] office/Kyliana Standen re: GOLD 3 copd maint on breztri    Chief Complaint  Patient presents with   COPD     shob  had pna in jan.  Coughing with green mucus    Dyspnea:  room to room / can't do groceries x nov 202  Cough: green scant sporadic  Sleeping: flat bed one pillow s  resp cc  SABA use: none  02: 2lpm hs    No obvious day to day or daytime variability or assoc r mucus plugs or hemoptysis or cp or chest tightness, subjective wheeze or overt sinus or hb symptoms.    Also denies any obvious fluctuation of symptoms with weather or environmental changes or other aggravating or alleviating factors except as outlined above   No unusual exposure hx or h/o childhood pna/ asthma or knowledge of premature birth.  Current Allergies,  Complete Past Medical History, Past Surgical History, Family History, and Social History were reviewed in [employer] record.  ROS  The following are not active complaints unless bolded Hoarseness, sore throat, dysphagia, dental problems, itching, sneezing,  nasal congestion or discharge of excess mucus or purulent secretions, ear ache,   fever, chills, sweats, unintended wt loss or wt gain, classically pleuritic or exertional cp,  orthopnea pnd or arm/hand swelling  or leg swelling, presyncope, palpitations, abdominal pain, anorexia, nausea, vomiting, diarrhea  or change in bowel habits or change in bladder habits, change in stools or change in urine, dysuria, hematuria,  rash, arthralgias, visual complaints, headache, numbness, weakness or ataxia or problems with walking or coordination,  change in mood or  memory.        Current Meds  Medication Sig   albuterol  (PROVENTIL ) (2.5 MG/3ML) 0.083% nebulizer solution Take 2.5 mg by nebulization every 6 (six) hours as needed for wheezing or shortness of breath.   amLODipine (NORVASC) 5 MG tablet Take 5 mg by mouth daily.   BREZTRI  AEROSPHERE 160-9-4.8 MCG/ACT AERO inhaler INHALE 2 PUFFS INTO THE LUNGS TWICE DAILY   famotidine  (PEPCID ) 20 MG tablet One after supper   OXYGEN 2lpm with sleep and exertion [facility] [place]   pantoprazole  (PROTONIX ) 40 MG tablet TAKE 1 TABLET(40 MG) BY MOUTH DAILY 30 TO 60 MINUTES BEFORE FIRST MEAL OF THE DAY   potassium chloride (K-DUR,KLOR-CON) 10 MEQ tablet Take 10 mEq by mouth daily.   Rosuvastatin Calcium (CRESTOR PO) Take 1 tablet by mouth daily. ? strength   Vitamin D, Ergocalciferol, (DRISDOL) 50000 units CAPS capsule Take 50,000 Units by mouth every 7 (seven) days.            Objective:   Physical Exam  Wts  02/11/2024        104  04/23/2023      111  03/22/2022      121   12/13/2021         121  02/21/2021      122  11/12/2020         120 01/28/2020       124  10/29/2019        126  12/25/2017       134  11/23/2017       139   05/11/17 138 lb 12.8 oz (  63 kg)  02/06/17 137 lb 3.2 oz (62.2 kg)  12/26/16 136 lb (61.7 kg)    Vital signs reviewed  02/11/2024  - Note at rest 02 sats  94% on RA   General appearance:    amb elderly wf looks pretty good today / not really chronically ill appearing       HEENT :  Oropharynx  clear   Nasal turbinates nl    NECK :  without JVD/Nodes/TM/ nl carotid upstrokes bilaterally   LUNGS: no acc muscle use,  Mod barrel  contour chest wall with bilateral  insp/exp rhonchi and  Distant bs s audible wheeze and  without cough on insp or exp maneuvers and mod  Hyperresonant  to  percussion bilaterally     CV:  RRR  no s3 or murmur or increase in P2, and no edema   ABD:  soft and nontender with pos mid insp Hoover's  in the supine position. No bruits or organomegaly appreciated, bowel sounds nl  MS:   Ext warm without deformities or   obvious joint restrictions , calf tenderness, cyanosis or clubbing  SKIN: warm and dry without lesions    NEURO:  alert, approp, nl sensorium with  no motor or cerebellar deficits apparent.          Assessment & Plan:   Assessment & Plan COPD GOLD III Quit smoking 2009/MM Spirometry 04/11/2016  FEV1 0.75 (30%)  Ratio 43 p am spiriva  dpi/ sym 160  With classic curvature  -04/11/2016  changed to spiriva  respimat   - Allergy  profile 11/23/17  >  Eos 0.1 /  IgE  23 RAST neg  - improved as of 12/25/2017 on trelegy > pulmonary f/u prn  - [employer] AT  Screen   12/25/17   MM / 163  - 10/29/2019  >>>   start breztri  - 01/28/2020  After extensive coaching inhaler device,  effectiveness =    90%   Group E in terms of symptoms/risk so  laba/lama/ICS  therefore appropriate rx at this point  >>>  breztri   but if turns out to have MAI or bronchiectasis I would favor symbicort  80 with spiriva  2.5 reduce the ICS effects   Chronic respiratory failure with hypoxia (HCC) Continue 2lpm hs and prn  advised:  >>>Make sure you check your oxygen saturation  AT  your highest level of activity (not after you stop)   to be sure it stays over 90% and adjust  02 flow upward to maintain this level if needed but remember to turn it back to previous settings when you stop    Multiple pulmonary nodules determined by computed tomography of lung See LDSCT 12/1323 rec doxy x 14 days pre- repeat as long as there is clinical response (resolution of green sputum) - otherwise use levaquin x500 x 10 days for short term MI benefit then do HRCT which will also show bronchiectasis if present   Discussed in detail all the  indications, usual  risks and alternatives  relative to the benefits with patient who agrees to proceed with Rx as outlined.              Each maintenance medication was reviewed in detail including emphasizing most importantly the difference between maintenance and prns and under what circumstances the prns are to be triggered using an action plan format where appropriate.  Total time for H and P, chart review, counseling, reviewing hfa/ neb/ 02 / pulse ox  device(s) and generating customized  AVS unique to this office visit / same day charting = 31 min        AVS  Patient Instructions  Doxycyline 100 mg twice daily with large glass of water twice daily x 2 weeks   Keep appt for CT chest until you feel bad again with green sputum because we would like you to do a different scan after a different antibiotic (HRCT p levaquin 500 mg daily x 10days)   Please schedule  change in follow up visit to  6 months but call sooner if needed       Ozell America, MD 02/11/2024

## 2024-02-11 NOTE — Assessment & Plan Note (Addendum)
 See LDSCT 12/1323 rec doxy x 14 days pre- repeat as long as there is clinical response (resolution of green sputum) - otherwise use levaquin x500 x 10 days for short term MI benefit then do HRCT which will also show bronchiectasis if present   Discussed in detail all the  indications, usual  risks and alternatives  relative to the benefits with patient who agrees to proceed with Rx as outlined.

## 2024-02-11 NOTE — Assessment & Plan Note (Signed)
 Continue 2lpm hs and prn advised:  >>>Make sure you check your oxygen saturation  AT  your highest level of activity (not after you stop)   to be sure it stays over 90% and adjust  02 flow upward to maintain this level if needed but remember to turn it back to previous settings when you stop

## 2024-02-11 NOTE — Assessment & Plan Note (Addendum)
 Quit smoking 2009/MM Spirometry 04/11/2016  FEV1 0.75 (30%)  Ratio 43 p am spiriva  dpi/ sym 160  With classic curvature  -04/11/2016  changed to spiriva  respimat   - Allergy  profile 11/23/17  >  Eos 0.1 /  IgE  23 RAST neg  - improved as of 12/25/2017 on trelegy > pulmonary f/u prn  - [employer] AT  Screen   12/25/17   MM / 163  - 10/29/2019  >>>   start breztri  - 01/28/2020  After extensive coaching inhaler device,  effectiveness =    90%   Group E in terms of symptoms/risk so  laba/lama/ICS  therefore appropriate rx at this point  >>>  breztri   but if turns out to have MAI or bronchiectasis I would favor symbicort  80 with spiriva  2.5 reduce the ICS effects

## 2024-02-11 NOTE — Patient Instructions (Addendum)
 Doxycyline 100 mg twice daily with large glass of water twice daily x 2 weeks   Keep appt for CT chest until you feel bad again with green sputum because we would like you to do a different scan after a different antibiotic (HRCT p levaquin 500 mg daily x 10days)   Please schedule  change in follow up visit to  6 months but call sooner if needed

## 2024-03-25 ENCOUNTER — Ambulatory Visit (HOSPITAL_COMMUNITY)

## 2024-04-01 ENCOUNTER — Ambulatory Visit: Admitting: Pulmonary Disease

## 2024-04-07 ENCOUNTER — Telehealth: Payer: Self-pay

## 2024-04-07 NOTE — Telephone Encounter (Signed)
 Spoke with patient. She continues to have respiratory illnesses and treated with antibiotics. Advised we will follow up in 8 weeks and see how she is feeling.

## 2024-05-27 ENCOUNTER — Telehealth: Payer: Self-pay

## 2024-05-27 NOTE — Telephone Encounter (Signed)
 Called patient to schedule follow up scan and confirm no recent illness. States she has been having intermittent pain on inspiration. No cough or illness. She is currently not on any treatment. Advised we would call back in 2 weeks for update and schedule scan if no illness. Pt wanted to wait and see if pain was related to new respiratory illness.

## 2024-07-23 ENCOUNTER — Ambulatory Visit (HOSPITAL_COMMUNITY)

## 2024-08-26 ENCOUNTER — Ambulatory Visit: Admitting: Internal Medicine
